# Patient Record
Sex: Female | Born: 1950 | Race: Black or African American | Hispanic: No | Marital: Single | Smoking: Current every day smoker
Health system: Southern US, Community
[De-identification: ages and names within clinical notes are randomized; demographics above are authoritative.]

## PROBLEM LIST (undated history)

## (undated) DIAGNOSIS — I10 Essential (primary) hypertension: Secondary | ICD-10-CM

## (undated) DIAGNOSIS — M810 Age-related osteoporosis without current pathological fracture: Secondary | ICD-10-CM

## (undated) DIAGNOSIS — K219 Gastro-esophageal reflux disease without esophagitis: Secondary | ICD-10-CM

## (undated) DIAGNOSIS — M1990 Unspecified osteoarthritis, unspecified site: Secondary | ICD-10-CM

## (undated) HISTORY — DX: Age-related osteoporosis without current pathological fracture: M81.0

## (undated) HISTORY — DX: Gastro-esophageal reflux disease without esophagitis: K21.9

## (undated) HISTORY — DX: Unspecified osteoarthritis, unspecified site: M19.90

## (undated) HISTORY — PX: TONSILLECTOMY: SUR1361

## (undated) HISTORY — PX: PERIPHERAL VASCULAR THROMBECTOMY: CATH118306

---

## 2009-07-16 ENCOUNTER — Emergency Department (HOSPITAL_BASED_OUTPATIENT_CLINIC_OR_DEPARTMENT_OTHER): Admission: EM | Admit: 2009-07-16 | Discharge: 2009-07-16 | Payer: Self-pay | Admitting: Emergency Medicine

## 2010-05-01 LAB — BASIC METABOLIC PANEL
BUN: 13 mg/dL (ref 6–23)
CO2: 28 mEq/L (ref 19–32)
Chloride: 105 mEq/L (ref 96–112)
Creatinine, Ser: 0.8 mg/dL (ref 0.4–1.2)
GFR calc Af Amer: >60 mL/min (ref >60)

## 2012-11-04 DIAGNOSIS — Z720 Tobacco use: Secondary | ICD-10-CM | POA: Insufficient documentation

## 2013-04-30 DIAGNOSIS — E876 Hypokalemia: Secondary | ICD-10-CM | POA: Insufficient documentation

## 2013-04-30 DIAGNOSIS — I10 Essential (primary) hypertension: Secondary | ICD-10-CM | POA: Insufficient documentation

## 2013-06-20 ENCOUNTER — Encounter (HOSPITAL_BASED_OUTPATIENT_CLINIC_OR_DEPARTMENT_OTHER): Payer: Self-pay | Admitting: Emergency Medicine

## 2013-06-20 ENCOUNTER — Emergency Department (HOSPITAL_BASED_OUTPATIENT_CLINIC_OR_DEPARTMENT_OTHER)
Admission: EM | Admit: 2013-06-20 | Discharge: 2013-06-20 | Disposition: A | Discharge status: Home / Self Care | Payer: No Typology Code available for payment source | Attending: Emergency Medicine | Admitting: Emergency Medicine

## 2013-06-20 ENCOUNTER — Emergency Department (HOSPITAL_BASED_OUTPATIENT_CLINIC_OR_DEPARTMENT_OTHER): Payer: No Typology Code available for payment source

## 2013-06-20 DIAGNOSIS — F17200 Nicotine dependence, unspecified, uncomplicated: Secondary | ICD-10-CM | POA: Insufficient documentation

## 2013-06-20 DIAGNOSIS — Y929 Unspecified place or not applicable: Secondary | ICD-10-CM | POA: Insufficient documentation

## 2013-06-20 DIAGNOSIS — S92309A Fracture of unspecified metatarsal bone(s), unspecified foot, initial encounter for closed fracture: Secondary | ICD-10-CM | POA: Insufficient documentation

## 2013-06-20 DIAGNOSIS — I10 Essential (primary) hypertension: Secondary | ICD-10-CM | POA: Insufficient documentation

## 2013-06-20 DIAGNOSIS — Y9302 Activity, running: Secondary | ICD-10-CM | POA: Insufficient documentation

## 2013-06-20 DIAGNOSIS — S92353A Displaced fracture of fifth metatarsal bone, unspecified foot, initial encounter for closed fracture: Secondary | ICD-10-CM

## 2013-06-20 DIAGNOSIS — Z79899 Other long term (current) drug therapy: Secondary | ICD-10-CM | POA: Insufficient documentation

## 2013-06-20 DIAGNOSIS — W010XXA Fall on same level from slipping, tripping and stumbling without subsequent striking against object, initial encounter: Secondary | ICD-10-CM | POA: Insufficient documentation

## 2013-06-20 HISTORY — DX: Essential (primary) hypertension: I10

## 2013-06-20 MED ORDER — HYDROCODONE-ACETAMINOPHEN 5-325 MG PO TABS: 1.0000 | ORAL_TABLET | ORAL | Status: DC | PRN

## 2013-06-20 NOTE — Discharge Instructions (Signed)
Metatarsal Fracture, Undisplaced A metatarsal fracture is a break in the bone(s) of the foot. These are the bones of the foot that connect your toes to the bones of the ankle. DIAGNOSIS  The diagnoses of these fractures are usually made with X-rays. If there are problems in the forefoot and x-rays are normal a later bone scan will usually make the diagnosis.  TREATMENT AND HOME CARE INSTRUCTIONS  Treatment may or may not include a cast or walking shoe. When casts are needed the use is usually for short periods of time so as not to slow down healing with muscle wasting (atrophy).  Activities should be stopped until further advised by your caregiver.  Wear shoes with adequate shock absorbing capabilities and stiff soles.  Alternative exercise may be undertaken while waiting for healing. These may include bicycling and swimming, or as your caregiver suggests.  It is important to keep all follow-up visits or specialty referrals. The failure to keep these appointments could result in improper bone healing and chronic pain or disability.  Warning: Do not drive a car or operate a motor vehicle until your caregiver specifically tells you it is safe to do so. IF YOU DO NOT HAVE A CAST OR SPLINT:  You may walk on your injured foot as tolerated or advised.  Do not put any weight on your injured foot for as long as directed by your caregiver. Slowly increase the amount of time you walk on the foot as the pain allows or as advised.  Use crutches until you can bear weight without pain. A gradual increase in weight bearing may help.  Apply ice to the injury for 15-20 minutes each hour while awake for the first 2 days. Put the ice in a plastic bag and place a towel between the bag of ice and your skin.  Only take over-the-counter or prescription medicines for pain, discomfort, or fever as directed by your caregiver. SEEK IMMEDIATE MEDICAL CARE IF:   Your cast gets damaged or breaks.  You have  continued severe pain or more swelling than you did before the cast was put on, or the pain is not controlled with medications.  Your skin or nails below the injury turn blue or grey, or feel cold or numb.  There is a bad smell, or new stains or pus-like (purulent) drainage coming from the cast. MAKE SURE YOU:   Understand these instructions.  Will watch your condition.  Will get help right away if you are not doing well or get worse. Document Released: 10/21/2001 Document Revised: 04/23/2011 Document Reviewed: 09/12/2007 [employer] Patient Information 2014 [employer].  

## 2013-06-20 NOTE — ED Notes (Signed)
Patient c/o R ankle pain, fell last Friday when running, swollen

## 2013-06-20 NOTE — ED Provider Notes (Signed)
CSN: [id number]     Arrival date & time 06/20/13  0734 History   First MD Initiated Contact with Patient 06/20/13 0745     Chief Complaint  Patient presents with  . Ankle Pain     (Consider location/radiation/quality/duration/timing/severity/associated sxs/prior Treatment) HPI Comments: Patient presents with right foot pain. She complains of constant throbbing pain to her right foot is worse when she bears weight on it. She had an injury that happened 7 days ago. She was running and tripped over a piece of luggage and twisted her right foot. She states that it's been hurting since that time that she's been taking ibuprofen and has been doing okay with that. However today she noticed some discoloration to the top of her foot and her toes and was concerned about it and came in to have it evaluated. She denies any other injuries.  Patient is a 63 y.o. female presenting with ankle pain.  Ankle Pain Associated symptoms: no back pain, no fever and no neck pain     Past Medical History  Diagnosis Date  . Hypertension    Past Surgical History  Procedure Laterality Date  . Tonsillectomy     No family history on file. History  Substance Use Topics  . Smoking status: Current Every Day Smoker  . Smokeless tobacco: Not on file  . Alcohol Use: Yes   OB History   Grav Para Term Preterm Abortions TAB SAB Ect Mult Living                 Review of Systems  Constitutional: Negative for fever.  Gastrointestinal: Negative for nausea and vomiting.  Musculoskeletal: Positive for arthralgias and joint swelling. Negative for back pain and neck pain.  Skin: Negative for wound.  Neurological: Negative for weakness, numbness and headaches.      Allergies  Review of patient's allergies indicates no known allergies.  Home Medications   Prior to Admission medications   Medication Sig Start Date End Date Taking? Authorizing Provider  hydrochlorothiazide (HYDRODIURIL) 25 MG tablet Take 25 mg by  mouth daily.   Yes Historical Provider, MD  potassium chloride (K-DUR) 10 MEQ tablet Take 10 mEq by mouth daily.   Yes Historical Provider, MD   BP 140/74  Pulse 76  Temp(Src) 98.2 F (36.8 C) (Oral)  Resp 18  SpO2 98% Physical Exam  Constitutional: She is oriented to person, place, and time. She appears well-developed and well-nourished.  HENT:  Head: Normocephalic and atraumatic.  Neck: Normal range of motion. Neck supple.  Cardiovascular: Normal rate.   Pulmonary/Chest: Effort normal.  Musculoskeletal: She exhibits edema and tenderness.  Patient has some swelling over the dorsum of the right foot, primarily along the fourth and fifth metatarsals. There's tenderness along the fourth and fifth metatarsals. She has some ecchymosis over the distal part of the foot and the middle toes. There is no deformities noted. She has normal sensation and motor function in the foot. Pedal pulses are intact. There are no wounds.  Neurological: She is alert and oriented to person, place, and time.  Skin: Skin is warm and dry.  Psychiatric: She has a normal mood and affect.    ED Course  Procedures (including critical care time) Labs Review Labs Reviewed - No data to display  Imaging Review Dg Foot Complete Right  06/20/2013   CLINICAL DATA:  Persistent foot pain after injury 1 week ago  EXAM: RIGHT FOOT COMPLETE - 3+ VIEW  COMPARISON:  None.  FINDINGS: Acute  minimally displaced fracture through the base of the fifth metatarsal. The position of the fracture is consistent with a Jones type injury. 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 Remainder the visualized bones and joints are unremarkable. Mild soft tissue swelling over the lateral foot.  IMPRESSION: Jones fracture of the base of the fifth metatarsal.   Electronically Signed   By: Malachy MoanHeath  McCullough M.D.   On: 06/20/2013 08:27     EKG Interpretation None      MDM   Final  diagnoses:  Fracture of 5th metatarsal    Patient is placed in a posterior splint. She was advised to be nonweightbearing. She was given crutches. She was given referral to followup with orthopedics within the next week. She was given a prescription for Vicodin to use in addition to her advil that she's been using at home.    Rolan BuccoMelanie Evalynn Hankins, MD 06/20/13 [id number]

## 2013-06-23 ENCOUNTER — Encounter: Payer: Self-pay | Admitting: Family Medicine

## 2013-06-23 ENCOUNTER — Ambulatory Visit (INDEPENDENT_AMBULATORY_CARE_PROVIDER_SITE_OTHER): Payer: 59 | Admitting: Family Medicine

## 2013-06-23 VITALS — BP 132/83 | HR 69 | Ht 63.0 in | Wt 158.0 lb

## 2013-06-23 DIAGNOSIS — S99199A Other physeal fracture of unspecified metatarsal, initial encounter for closed fracture: Secondary | ICD-10-CM

## 2013-06-23 DIAGNOSIS — S92309A Fracture of unspecified metatarsal bone(s), unspecified foot, initial encounter for closed fracture: Secondary | ICD-10-CM

## 2013-06-23 MED ORDER — HYDROCODONE-ACETAMINOPHEN 5-325 MG PO TABS: 1.0000 | ORAL_TABLET | Freq: Four times a day (QID) | ORAL | Status: DC | PRN

## 2013-06-23 NOTE — Patient Instructions (Signed)
You have a Jones fracture of the 5th metatarsal. Do not put any weight on this foot. Use crutches or a knee scooter to help get around. We placed a cast today. Elevate above the level of your heart as much as possible. Norco as needed for severe pain. Out of work for total of 6 weeks. Follow up with me in 2 weeks.

## 2013-06-24 ENCOUNTER — Encounter: Payer: Self-pay | Admitting: Family Medicine

## 2013-06-24 DIAGNOSIS — S99199A Other physeal fracture of unspecified metatarsal, initial encounter for closed fracture: Secondary | ICD-10-CM | POA: Insufficient documentation

## 2013-06-24 NOTE — Assessment & Plan Note (Signed)
discussed high risk of nonunion with this type of fracture.  Will start with conservative treatment - Short leg cast placed and patient to be strictly non weight bearing for at least 6 weeks.  Elevation, norco as needed.  Out of work at least 6 weeks.  F/u in 2 weeks to remove cast and repeat radiographs.

## 2013-06-24 NOTE — Progress Notes (Signed)
Patient ID: Stephanie Phillips, female   DOB: [birth year], 63 y.o.   MRN: [id number]  PCP: No PCP Per Patient  Subjective:   HPI: Patient is a 63 y.o. female here for right foot injury.  Patient reports on 5/1 she accidentally tri[place] luggage at the airport. Caused her to invert right ankle. Severe pain, swelling, bruising lateral foot. Unable to bear weight well following this - tried to give it more time but wasn't improving so went to ED on 5/9. Radiographs showed a Jones fracture of right fifth metatarsal. Placed in posterior splint, non weight bearing and referred to us. No prior injuries.  Past Medical History  Diagnosis Date  . Hypertension     Current Outpatient Prescriptions on File Prior to Visit  Medication Sig Dispense Refill  . hydrochlorothiazide (HYDRODIURIL) 25 MG tablet Take 25 mg by mouth daily.      . potassium chloride (K-DUR) 10 MEQ tablet Take 10 mEq by mouth daily.       No current facility-administered medications on file prior to visit.    Past Surgical History  Procedure Laterality Date  . Tonsillectomy      No Known Allergies  History   Social History  . Marital Status: Single    Spouse Name: N/A    Number of Children: N/A  . Years of Education: N/A   Occupational History  . Not on file.   Social History Main Topics  . Smoking status: Current Every Day Smoker -- 1.00 packs/day    Types: Cigarettes  . Smokeless tobacco: Not on file  . Alcohol Use: Yes  . Drug Use: No  . Sexual Activity: Not on file   Other Topics Concern  . Not on file   Social History Narrative  . No narrative on file    Family History  Problem Relation Age of Onset  . Hypertension Mother   . Hypertension Father     BP 132/83  Pulse 69  Ht 5\' 3"  (1.6 m)  Wt 158 lb (71.668 kg)  BMI 28.00 kg/m2  Review of Systems: See HPI above.    Objective:  Physical Exam:  Gen: NAD  Right foot/ankle: Splint removed. Moderate swelling, bruising dorsolateral  foot. Did not test ROM with known fracture. TTP base 5th metatarsal. Negative ant drawer and talar tilt.   Negative syndesmotic compression. Thompsons test negative. NV intact distally.    Assessment & Plan:  1. Jones fracture of right 5th metatarsal - discussed high risk of nonunion with this type of fracture.  Will start with conservative treatment - Short leg cast placed and patient to be strictly non weight bearing for at least 6 weeks.  Elevation, norco as needed.  Out of work at least 6 weeks.  F/u in 2 weeks to remove cast and repeat radiographs.

## 2013-07-07 ENCOUNTER — Encounter: Payer: Self-pay | Admitting: Family Medicine

## 2013-07-07 ENCOUNTER — Ambulatory Visit (HOSPITAL_BASED_OUTPATIENT_CLINIC_OR_DEPARTMENT_OTHER)
Admission: RE | Admit: 2013-07-07 | Discharge: 2013-07-07 | Disposition: A | Discharge status: Home / Self Care | Payer: 59 | Source: Ambulatory Visit | Attending: Family Medicine | Admitting: Family Medicine

## 2013-07-07 ENCOUNTER — Ambulatory Visit (INDEPENDENT_AMBULATORY_CARE_PROVIDER_SITE_OTHER): Payer: 59 | Admitting: Family Medicine

## 2013-07-07 VITALS — BP 148/76 | HR 114 | Temp 98.3°F | Wt 154.0 lb

## 2013-07-07 DIAGNOSIS — M79609 Pain in unspecified limb: Secondary | ICD-10-CM

## 2013-07-07 DIAGNOSIS — S92309A Fracture of unspecified metatarsal bone(s), unspecified foot, initial encounter for closed fracture: Secondary | ICD-10-CM | POA: Insufficient documentation

## 2013-07-07 DIAGNOSIS — S99199A Other physeal fracture of unspecified metatarsal, initial encounter for closed fracture: Secondary | ICD-10-CM

## 2013-07-07 DIAGNOSIS — X58XXXA Exposure to other specified factors, initial encounter: Secondary | ICD-10-CM | POA: Insufficient documentation

## 2013-07-07 DIAGNOSIS — M79671 Pain in right foot: Secondary | ICD-10-CM

## 2013-07-08 ENCOUNTER — Encounter: Payer: Self-pay | Admitting: Family Medicine

## 2013-07-08 NOTE — Assessment & Plan Note (Signed)
Jones fracture of right 5th metatarsal - Patient is clinically significantly better which is a good prognostic sign.  Radiographs show fracture line more visible but on my read may be more cloudy in actual fracture consistent with healing.  Discussed these sometimes become fibrous nonunions as well and clinical appearance is most important (especially as radiographs lag behind clinical healing by up to 4 weeks in many cases).  New short leg cast placed today.  Will f/u when she is 6 weeks out, remove cast, repeat radiographs, assess clinically.  Non weight bearing still.  Continue out of work until next visit at least.

## 2013-07-08 NOTE — Progress Notes (Signed)
Patient ID: Stephanie Phillips, female   DOB: 1950/06/04, 63 y.o.   MRN: [id number]  PCP: No PCP Per Patient  Subjective:   HPI: Patient is a 63 y.o. female here for right foot injury.  5/12: Patient reports on 5/1 she accidentally tripped over luggage at the airport. Caused her to invert right ankle. Severe pain, swelling, bruising lateral foot. Unable to bear weight well following this - tried to give it more time but wasn't improving so went to ED on 5/9. Radiographs showed a Jones fracture of right fifth metatarsal. Placed in posterior splint, non weight bearing and referred to [place]. No prior injuries.  5/26: Patient reports she's doing extremely well. Wearing cast and not weight bearing. No pain currently. Not taking any medicines for pain either.  Past Medical History  Diagnosis Date  . Hypertension     Current Outpatient Prescriptions on File Prior to Visit  Medication Sig Dispense Refill  . hydrochlorothiazide (HYDRODIURIL) 25 MG tablet Take 25 mg by mouth daily.      Marland Kitchen HYDROcodone-acetaminophen (NORCO/VICODIN) 5-325 MG per tablet Take 1 tablet by mouth every 6 (six) hours as needed.  60 tablet  0  . potassium chloride (K-DUR) 10 MEQ tablet Take 10 mEq by mouth daily.       No current facility-administered medications on file prior to visit.    Past Surgical History  Procedure Laterality Date  . Tonsillectomy      No Known Allergies  History   Social History  . Marital Status: Single    Spouse Name: N/A    Number of Children: N/A  . Years of Education: N/A   Occupational History  . Not on file.   Social History Main Topics  . Smoking status: Current Every Day Smoker -- 1.00 packs/day    Types: Cigarettes  . Smokeless tobacco: Not on file  . Alcohol Use: Yes  . Drug Use: No  . Sexual Activity: Not on file   Other Topics Concern  . Not on file   Social History Narrative  . No narrative on file    Family History  Problem Relation Age of Onset  .  Hypertension Mother   . Hypertension Father     BP 148/76  Pulse 114  Temp(Src) 98.3 F (36.8 C) (Oral)  Wt 154 lb (69.854 kg)  Review of Systems: See HPI above.    Objective:  Physical Exam:  Gen: NAD  Right foot/ankle: Cast removed. Minimal swelling, bruising dorsolateral foot. Did not test ROM with known fracture. No longer with TTP base 5th metatarsal. NV intact distally.    Assessment & Plan:  1. Jones fracture of right 5th metatarsal - Patient is clinically significantly better which is a good prognostic sign.  Radiographs show fracture line more visible but on my read may be more cloudy in actual fracture consistent with healing.  Discussed these sometimes become fibrous nonunions as well and clinical appearance is most important (especially as radiographs lag behind clinical healing by up to 4 weeks in many cases).  New short leg cast placed today.  Will f/u when she is 6 weeks out, remove cast, repeat radiographs, assess clinically.  Non weight bearing still.  Continue out of work until next visit at least.

## 2013-07-24 ENCOUNTER — Encounter: Payer: Self-pay | Admitting: Family Medicine

## 2013-07-24 ENCOUNTER — Ambulatory Visit (INDEPENDENT_AMBULATORY_CARE_PROVIDER_SITE_OTHER): Payer: 59 | Admitting: Family Medicine

## 2013-07-24 VITALS — BP 122/79 | HR 98 | Ht 63.0 in | Wt 150.0 lb

## 2013-07-24 DIAGNOSIS — S99199A Other physeal fracture of unspecified metatarsal, initial encounter for closed fracture: Secondary | ICD-10-CM

## 2013-07-24 DIAGNOSIS — S92309A Fracture of unspecified metatarsal bone(s), unspecified foot, initial encounter for closed fracture: Secondary | ICD-10-CM

## 2013-07-24 NOTE — Patient Instructions (Signed)
Wear walking boot when up and walking around until I see you back. Follow up with me in 4-6 weeks.

## 2013-07-27 ENCOUNTER — Encounter: Payer: Self-pay | Admitting: Family Medicine

## 2013-07-27 NOTE — Assessment & Plan Note (Signed)
Patient still without any pain and now 6 weeks out nonweight bearing with immobilization.  No tenderness on exam either.  Suspicious for a fibrous nonunion.  Regardless will continue with cam walker but over next 1-2 weeks start to put more weight on this.  Call if she has any pain while doing this - would make non weight bearing for longer period.  Otherwise f/u in 4-6 weeks.

## 2013-07-27 NOTE — Progress Notes (Signed)
Patient ID: Stephanie Phillips, female   DOB: 1950-10-18, 63 y.o.   MRN: [id number]  PCP: No PCP Per Patient  Subjective:   HPI: Patient is a 63 y.o. female here for right foot injury.  5/12: Patient reports on 5/1 she accidentally tripped over luggage at the airport. Caused her to invert right ankle. Severe pain, swelling, bruising lateral foot. Unable to bear weight well following this - tried to give it more time but wasn't improving so went to ED on 5/9. Radiographs showed a Jones fracture of right fifth metatarsal. Placed in posterior splint, non weight bearing and referred to us. No prior injuries.  5/26: Patient reports she's doing extremely well. Wearing cast and not weight bearing. No pain currently. Not taking any medicines for pain either.  6/15: Patient returns in cast. Not weight bearing. No pain. No other complaints.  Past Medical History  Diagnosis Date  . Hypertension     Current Outpatient Prescriptions on File Prior to Visit  Medication Sig Dispense Refill  . hydrochlorothiazide (HYDRODIURIL) 25 MG tablet Take 25 mg by mouth daily.      Marland Kitchen. HYDROcodone-acetaminophen (NORCO/VICODIN) 5-325 MG per tablet Take 1 tablet by mouth every 6 (six) hours as needed.  60 tablet  0  . potassium chloride (K-DUR) 10 MEQ tablet Take 10 mEq by mouth daily.       No current facility-administered medications on file prior to visit.    Past Surgical History  Procedure Laterality Date  . Tonsillectomy      No Known Allergies  History   Social History  . Marital Status: Single    Spouse Name: N/A    Number of Children: N/A  . Years of Education: N/A   Occupational History  . Not on file.   Social History Main Topics  . Smoking status: Current Every Day Smoker -- 1.00 packs/day    Types: Cigarettes  . Smokeless tobacco: Not on file  . Alcohol Use: Yes  . Drug Use: No  . Sexual Activity: Not on file   Other Topics Concern  . Not on file   Social History  Narrative  . No narrative on file    Family History  Problem Relation Age of Onset  . Hypertension Mother   . Hypertension Father     BP 122/79  Pulse 98  Ht 5\' 3"  (1.6 m)  Wt 150 lb (68.04 kg)  BMI 26.58 kg/m2  Review of Systems: See HPI above.    Objective:  Physical Exam:  Gen: NAD  Right foot/ankle: Cast removed. Minimal swelling, no bruising dorsolateral foot. Mod limitation ROM all directions. No longer with TTP base 5th metatarsal. NV intact distally.  MSK u/s:  Fracture line still visible but no edema, neovascularity at fracture site 5th metatarsal.    Assessment & Plan:  1. Jones fracture of right 5th metatarsal - Patient still without any pain and now 6 weeks out nonweight bearing with immobilization.  No tenderness on exam either.  Suspicious for a fibrous nonunion.  Regardless will continue with cam walker but over next 1-2 weeks start to put more weight on this.  Call if she has any pain while doing this - would make non weight bearing for longer period.  Otherwise f/u in 4-6 weeks.

## 2013-08-03 ENCOUNTER — Encounter: Payer: Self-pay | Admitting: Family Medicine

## 2013-08-03 ENCOUNTER — Telehealth: Payer: Self-pay | Admitting: Family Medicine

## 2013-08-03 NOTE — Telephone Encounter (Signed)
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

## 2013-08-28 ENCOUNTER — Encounter: Payer: Self-pay | Admitting: Family Medicine

## 2013-08-28 ENCOUNTER — Ambulatory Visit (INDEPENDENT_AMBULATORY_CARE_PROVIDER_SITE_OTHER): Payer: 59 | Admitting: Family Medicine

## 2013-08-28 VITALS — BP 135/82 | HR 75 | Ht 63.0 in | Wt 150.0 lb

## 2013-08-28 DIAGNOSIS — IMO0002 Reserved for concepts with insufficient information to code with codable children: Secondary | ICD-10-CM

## 2013-08-28 DIAGNOSIS — S92351K Displaced fracture of fifth metatarsal bone, right foot, subsequent encounter for fracture with nonunion: Secondary | ICD-10-CM

## 2013-08-28 NOTE — Patient Instructions (Signed)
Switch from the boot to a supportive shoe (avoiding flat shoes, barefoot walking as much as possible). Light duty for 2 more weeks then back to full duty. Follow up with me in 6 weeks.

## 2013-09-01 ENCOUNTER — Encounter: Payer: Self-pay | Admitting: Family Medicine

## 2013-09-01 NOTE — Progress Notes (Signed)
Patient ID: Delma PostJanet Brandau, female   DOB: 01-Aug-1950, 63 y.o.   MRN: [id number]  PCP: No PCP Per Patient  Subjective:   HPI: Patient is a 63 y.o. female here for right foot injury.  5/12: Patient reports on 5/1 she accidentally tripped over luggage at the airport. Caused her to invert right ankle. Severe pain, swelling, bruising lateral foot. Unable to bear weight well following this - tried to give it more time but wasn't improving so went to ED on 5/9. Radiographs showed a Jones fracture of right fifth metatarsal. Placed in posterior splint, non weight bearing and referred to us. No prior injuries.  5/26: Patient reports she's doing extremely well. Wearing cast and not weight bearing. No pain currently. Not taking any medicines for pain either.  6/15: Patient returns in cast. Not weight bearing. No pain. No other complaints.  7/17: Patient reports she is doing well. Back at work and driving without any problems. No pain though has some swelling. Has been icing for the swelling.  Past Medical History  Diagnosis Date  . Hypertension     Current Outpatient Prescriptions on File Prior to Visit  Medication Sig Dispense Refill  . hydrochlorothiazide (HYDRODIURIL) 25 MG tablet Take 25 mg by mouth daily.      Marland Kitchen. HYDROcodone-acetaminophen (NORCO/VICODIN) 5-325 MG per tablet Take 1 tablet by mouth every 6 (six) hours as needed.  60 tablet  0  . potassium chloride (K-DUR) 10 MEQ tablet Take 10 mEq by mouth daily.       No current facility-administered medications on file prior to visit.    Past Surgical History  Procedure Laterality Date  . Tonsillectomy      No Known Allergies  History   Social History  . Marital Status: Single    Spouse Name: N/A    Number of Children: N/A  . Years of Education: N/A   Occupational History  . Not on file.   Social History Main Topics  . Smoking status: Current Every Day Smoker -- 1.00 packs/day    Types: Cigarettes  .  Smokeless tobacco: Not on file  . Alcohol Use: Yes  . Drug Use: No  . Sexual Activity: Not on file   Other Topics Concern  . Not on file   Social History Narrative  . No narrative on file    Family History  Problem Relation Age of Onset  . Hypertension Mother   . Hypertension Father     BP 135/82  Pulse 75  Ht 5\' 3"  (1.6 m)  Wt 150 lb (68.04 kg)  BMI 26.58 kg/m2  Review of Systems: See HPI above.    Objective:  Physical Exam:  Gen: NAD  Right foot/ankle: Minimal swelling, no bruising dorsolateral foot. FROM without pain. No longer with TTP base 5th metatarsal. NV intact distally.  MSK u/s:  Fracture line still visible but no edema, no neovascularity at fracture site 5th metatarsal.    Assessment & Plan:  1. Jones fracture of right 5th metatarsal - Patient still without any pain and now 11 weeks out having no problems other than still having swelling which can persist.  No tenderness on exam, FROM.  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  Switch to supportive shoe.  Light duty 2 weeks then to full duty.  F/u in 6 weeks.  Call with any problems.

## 2013-09-01 NOTE — Assessment & Plan Note (Signed)
Jones fracture of right 5th metatarsal - Patient still without any pain and now 11 weeks out having no problems other than still having swelling which can persist.  No tenderness on exam, FROM.  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  Switch to supportive shoe.  Light duty 2 weeks then to full duty.  F/u in 6 weeks.  Call with any problems.

## 2013-10-02 ENCOUNTER — Ambulatory Visit (INDEPENDENT_AMBULATORY_CARE_PROVIDER_SITE_OTHER): Payer: 59 | Admitting: Family Medicine

## 2013-10-02 ENCOUNTER — Encounter: Payer: Self-pay | Admitting: Family Medicine

## 2013-10-02 VITALS — BP 119/75 | Ht 63.0 in | Wt 150.0 lb

## 2013-10-02 DIAGNOSIS — S92351K Displaced fracture of fifth metatarsal bone, right foot, subsequent encounter for fracture with nonunion: Secondary | ICD-10-CM

## 2013-10-02 DIAGNOSIS — IMO0002 Reserved for concepts with insufficient information to code with codable children: Secondary | ICD-10-CM

## 2013-10-05 ENCOUNTER — Encounter: Payer: Self-pay | Admitting: Family Medicine

## 2013-10-05 NOTE — Progress Notes (Signed)
Patient ID: Stephanie Phillips, female   DOB: 30-Apr-1950, 63 y.o.   MRN: [id number]  PCP: No PCP Per Patient  Subjective:   HPI: Patient is a 63 y.o. female here for right foot injury.  5/12: Patient reports on 5/1 she accidentally tri[place] luggage at the airport. Caused her to invert right ankle. Severe pain, swelling, bruising lateral foot. Unable to bear weight well following this - tried to give it more time but wasn't improving so went to ED on 5/9. Radiographs showed a Jones fracture of right fifth metatarsal. Placed in posterior splint, non weight bearing and referred to [place]. No prior injuries.  5/26: Patient reports she's doing extremely well. Wearing cast and not weight bearing. No pain currently. Not taking any medicines for pain either.  6/15: Patient returns in cast. Not weight bearing. No pain. No other complaints.  7/17: Patient reports she is doing well. Back at work and driving without any problems. No pain though has some swelling. Has been icing for the swelling.  8/21: Patient having no pain now. No other complaints. Walking with regular shoes - no limitations on her activities. No problems at work.  Past Medical History  Diagnosis Date  . Hypertension     Current Outpatient Prescriptions on File Prior to Visit  Medication Sig Dispense Refill  . hydrochlorothiazide (HYDRODIURIL) 25 MG tablet Take 25 mg by mouth daily.      Marland Kitchen HYDROcodone-acetaminophen (NORCO/VICODIN) 5-325 MG per tablet Take 1 tablet by mouth every 6 (six) hours as needed.  60 tablet  0  . potassium chloride (K-DUR) 10 MEQ tablet Take 10 mEq by mouth daily.       No current facility-administered medications on file prior to visit.    Past Surgical History  Procedure Laterality Date  . Tonsillectomy      No Known Allergies  History   Social History  . Marital Status: Single    Spouse Name: N/A    Number of Children: N/A  . Years of Education: N/A   Occupational  History  . Not on file.   Social History Main Topics  . Smoking status: Current Every Day Smoker -- 1.00 packs/day    Types: Cigarettes  . Smokeless tobacco: Not on file  . Alcohol Use: Yes  . Drug Use: No  . Sexual Activity: Not on file   Other Topics Concern  . Not on file   Social History Narrative  . No narrative on file    Family History  Problem Relation Age of Onset  . Hypertension Mother   . Hypertension Father     BP 119/75  Ht  (1.6 m)  Wt 150 lb (68.04 kg)  BMI 26.58 kg/m2  Review of Systems: See HPI above.    Objective:  Physical Exam:  Gen: NAD  Right foot/ankle: No swelling, bruising dorsolateral foot. FROM without pain. No longer with TTP base 5th metatarsal. NV intact distally.    Assessment & Plan:  1. 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  At work full duty and no issues.  Advised to call us with any concerns.  F/u prn.

## 2013-10-05 NOTE — Assessment & Plan Note (Signed)
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  At work full duty and no issues.  Advised to call us with any concerns.  F/u prn.

## 2015-04-13 ENCOUNTER — Ambulatory Visit (INDEPENDENT_AMBULATORY_CARE_PROVIDER_SITE_OTHER): Payer: 59 | Admitting: Family Medicine

## 2015-04-13 ENCOUNTER — Ambulatory Visit (HOSPITAL_BASED_OUTPATIENT_CLINIC_OR_DEPARTMENT_OTHER)
Admission: RE | Admit: 2015-04-13 | Discharge: 2015-04-13 | Disposition: A | Discharge status: Home / Self Care | Payer: 59 | Source: Ambulatory Visit | Attending: Family Medicine | Admitting: Family Medicine

## 2015-04-13 ENCOUNTER — Encounter: Payer: Self-pay | Admitting: Family Medicine

## 2015-04-13 VITALS — BP 124/87 | HR 69 | Ht 62.0 in | Wt 160.0 lb

## 2015-04-13 DIAGNOSIS — M25562 Pain in left knee: Secondary | ICD-10-CM | POA: Diagnosis not present

## 2015-04-13 DIAGNOSIS — M79672 Pain in left foot: Secondary | ICD-10-CM

## 2015-04-13 MED ORDER — DICLOFENAC SODIUM 75 MG PO TBEC: 75.0000 mg | DELAYED_RELEASE_TABLET | Freq: Two times a day (BID) | ORAL | Status: DC

## 2015-04-13 NOTE — Patient Instructions (Addendum)
You have an extensor tendinitis of your foot. Take voltaren  twice a day with food for 7-10 days then as needed for pain and inflammation. Icing 15 minutes at a time as needed. Do simple motion exercises of your ankle daily. Consider physical therapy - call me if you want to do this.  You also flared up the arthritis in this knee. These are the different medicines you can take for this: Tylenol  1-2 tabs three times a day for pain. Voltaren  twice a day with food for pain and inflammation. Glucosamine sulfate  twice a day is a supplement that may help. Capsaicin, aspercreme, or biofreeze topically up to four times a day may also help with pain. Cortisone injections are an option. If cortisone injections do not help, there are different types of shots that may help but they take longer to take effect. It's important that you continue to stay active. Straight leg raises, knee extensions 3 sets of 10 once a day (add ankle weight if these become too easy). Consider physical therapy to strengthen muscles around the joint that hurts to take pressure off of the joint itself. Shoe inserts with good arch support may be helpful. Heat or ice 15 minutes at a time 3-4 times a day as needed to help with pain. Water aerobics and cycling with low resistance are the best two types of exercise for arthritis. Follow up with me in 5-6 weeks or as needed. Call me sooner if you want to do the injection.

## 2015-04-14 DIAGNOSIS — M25562 Pain in left knee: Secondary | ICD-10-CM | POA: Insufficient documentation

## 2015-04-14 DIAGNOSIS — M79672 Pain in left foot: Secondary | ICD-10-CM | POA: Insufficient documentation

## 2015-04-14 NOTE — Assessment & Plan Note (Signed)
2/2 flare of DJD.  Tylenol, voltaren, glucosamine and topical medications discussed.  Shown home exercises to do daily.  Heat or ice if needed.  F/u in 5-6 weeks or prn.  Consider injection, PT if not improving.

## 2015-04-14 NOTE — Assessment & Plan Note (Signed)
Left extensor tendinitis - Radiographs and ultrasound independently reviewed - no evidence fracture.  Voltaren, icing, ROM exercises.  Declined physical therapy, cam walker for now.  F/u in 5-6 weeks or prn.

## 2015-04-14 NOTE — Progress Notes (Signed)
PCP: No PCP Per Patient  Subjective:   HPI: Patient is a 65 y.o. female here for left knee, left foot pain.  Patient reports for about 5 months she's had pain in left knee and left foot. Started with dropping conditioner bottle on her foot - pain never improved. Pain dull, constant at 5/10 level mid dorsal foot. Taking tylenol. Knee started hurting anteriorly as well shortly after this - pain here now 6/10 level, sharp. No skin changes, fever, other complaints.  Past Medical History  Diagnosis Date  . Hypertension     Current Outpatient Prescriptions on File Prior to Visit  Medication Sig Dispense Refill  . hydrochlorothiazide (HYDRODIURIL) 25 MG tablet Take 25 mg by mouth daily.    Marland Kitchen HYDROcodone-acetaminophen (NORCO/VICODIN) 5-325 MG per tablet Take 1 tablet by mouth every 6 (six) hours as needed. 60 tablet 0  . potassium chloride (K-DUR) 10 MEQ tablet Take 10 mEq by mouth daily.     No current facility-administered medications on file prior to visit.    Past Surgical History  Procedure Laterality Date  . Tonsillectomy      No Known Allergies  Social History   Social History  . Marital Status: Single    Spouse Name: N/A  . Number of Children: N/A  . Years of Education: N/A   Occupational History  . Not on file.   Social History Main Topics  . Smoking status: Current Every Day Smoker -- 1.00 packs/day    Types: Cigarettes  . Smokeless tobacco: Not on file  . Alcohol Use: 0.0 oz/week    0 Standard drinks or equivalent per week  . Drug Use: No  . Sexual Activity: Not on file   Other Topics Concern  . Not on file   Social History Narrative    Family History  Problem Relation Age of Onset  . Hypertension Mother   . Hypertension Father     BP 124/87 mmHg  Pulse 69  Ht  (1.575 m)  Wt 160 lb (72.576 kg)  BMI 29.26 kg/m2  Review of Systems: See HPI above.    Objective:  Physical Exam:  Gen: NAD, comfortable in exam room  Left knee: No  gross deformity, ecchymoses, swelling. TTP medial > lateral joint line. FROM. Negative ant/post drawers. Negative valgus/varus testing. Negative lachmanns. Negative mcmurrays, apleys, patellar apprehension. NV intact distally.  Left foot/ankle: No gross deformity, swelling, ecchymoses FROM without pain. TTP dorsal foot over extensor digitorum as crosses proximal metatarsals. Negative ant drawer and talar tilt.   Negative syndesmotic compression. Thompsons test negative. NV intact distally.  MSK u/s:  No cortical irregularity, edema overlying cortices of metatarsals of left foot.    Assessment & Plan:  1. Left knee pain - 2/2 flare of DJD.  Tylenol, voltaren, glucosamine and topical medications discussed.  Shown home exercises to do daily.  Heat or ice if needed.  F/u in 5-6 weeks or prn.  Consider injection, PT if not improving.  2. Left extensor tendinitis - Radiographs and ultrasound independently reviewed - no evidence fracture.  Voltaren, icing, ROM exercises.  Declined physical therapy, cam walker for now.  F/u in 5-6 weeks or prn.

## 2015-05-18 ENCOUNTER — Ambulatory Visit (INDEPENDENT_AMBULATORY_CARE_PROVIDER_SITE_OTHER): Payer: 59 | Admitting: Family Medicine

## 2015-05-18 ENCOUNTER — Encounter: Payer: Self-pay | Admitting: Family Medicine

## 2015-05-18 VITALS — BP 102/69 | HR 86 | Ht 63.0 in | Wt 160.0 lb

## 2015-05-18 DIAGNOSIS — M79672 Pain in left foot: Secondary | ICD-10-CM

## 2015-05-18 DIAGNOSIS — M25562 Pain in left knee: Secondary | ICD-10-CM | POA: Diagnosis not present

## 2015-05-19 NOTE — Assessment & Plan Note (Signed)
Left extensor tendinitis - Radiographs and ultrasound independently reviewed last visit - no evidence fracture.  Take diclofenac only as needed now.  F/u prn.

## 2015-05-19 NOTE — Assessment & Plan Note (Signed)
2/2 flare of DJD.  Improved with tylenol, voltaren.  Continue home exercises.  F/u prn.

## 2015-05-19 NOTE — Progress Notes (Signed)
PCP: No PCP Per Patient  Subjective:   HPI: Patient is a 65 y.o. female here for left knee, left foot pain.  3/1: Patient reports for about 5 months she's had pain in left knee and left foot. Started with dropping conditioner bottle on her foot - pain never improved. Pain dull, constant at 5/10 level mid dorsal foot. Taking tylenol. Knee started hurting anteriorly as well shortly after this - pain here now 6/10 level, sharp. No skin changes, fever, other complaints.  4/5: Patient reports she's doing very well. Her pain level is down to 1/10 of foot. No knee pain now. Taking diclofenac No swelling. No skin changes, fever.  Past Medical History  Diagnosis Date  . Hypertension     Current Outpatient Prescriptions on File Prior to Visit  Medication Sig Dispense Refill  . alendronate (FOSAMAX) 70 MG tablet TAKE 1 TAB WEEKLY IN AM ON EMPTY STOMACH WITH FULL GLASS OF WATER.DONT LIE DOWN OR EAT FOR 45 MIN.  3  . diclofenac (VOLTAREN) 75 MG EC tablet Take 1 tablet (75 mg total) by mouth 2 (two) times daily. 60 tablet 1  . hydrochlorothiazide (HYDRODIURIL) 25 MG tablet Take 25 mg by mouth daily.    Marland Kitchen. HYDROcodone-acetaminophen (NORCO/VICODIN) 5-325 MG per tablet Take 1 tablet by mouth every 6 (six) hours as needed. 60 tablet 0  . potassium chloride (K-DUR) 10 MEQ tablet Take 10 mEq by mouth daily.    . Vitamin D, Ergocalciferol, (DRISDOL) 50000 units CAPS capsule TAKE ONE CAPSULE BY MOUTH TWICE A WEEK  1   No current facility-administered medications on file prior to visit.    Past Surgical History  Procedure Laterality Date  . Tonsillectomy      No Known Allergies  Social History   Social History  . Marital Status: Single    Spouse Name: N/A  . Number of Children: N/A  . Years of Education: N/A   Occupational History  . Not on file.   Social History Main Topics  . Smoking status: Current Every Day Smoker -- 1.00 packs/day    Types: Cigarettes  . Smokeless tobacco:  Not on file  . Alcohol Use: 0.0 oz/week    0 Standard drinks or equivalent per week  . Drug Use: No  . Sexual Activity: Not on file   Other Topics Concern  . Not on file   Social History Narrative    Family History  Problem Relation Age of Onset  . Hypertension Mother   . Hypertension Father     BP 102/69 mmHg  Pulse 86  Ht 5\' 3"  (1.6 m)  Wt 160 lb (72.576 kg)  BMI 28.35 kg/m2  Review of Systems: See HPI above.    Objective:  Physical Exam:  Gen: NAD, comfortable in exam room  Left foot/ankle: No gross deformity, swelling, ecchymoses FROM without pain. No TTP. Negative ant drawer and talar tilt.   Negative syndesmotic compression. Thompsons test negative. NV intact distally.    Assessment & Plan:  1. Left knee pain - 2/2 flare of DJD.  Improved with tylenol, voltaren.  Continue home exercises.  F/u prn.  2. Left extensor tendinitis - Radiographs and ultrasound independently reviewed last visit - no evidence fracture.  Take diclofenac only as needed now.  F/u prn.

## 2015-06-16 ENCOUNTER — Ambulatory Visit (INDEPENDENT_AMBULATORY_CARE_PROVIDER_SITE_OTHER): Payer: 59 | Admitting: Family Medicine

## 2015-06-16 ENCOUNTER — Encounter: Payer: Self-pay | Admitting: Family Medicine

## 2015-06-16 ENCOUNTER — Telehealth: Payer: Self-pay | Admitting: Family Medicine

## 2015-06-16 VITALS — BP 131/74 | HR 69 | Temp 97.8°F | Ht 62.5 in | Wt 167.2 lb

## 2015-06-16 DIAGNOSIS — M810 Age-related osteoporosis without current pathological fracture: Secondary | ICD-10-CM | POA: Diagnosis not present

## 2015-06-16 DIAGNOSIS — Z131 Encounter for screening for diabetes mellitus: Secondary | ICD-10-CM | POA: Diagnosis not present

## 2015-06-16 DIAGNOSIS — Z13220 Encounter for screening for lipoid disorders: Secondary | ICD-10-CM | POA: Diagnosis not present

## 2015-06-16 DIAGNOSIS — I10 Essential (primary) hypertension: Secondary | ICD-10-CM

## 2015-06-16 DIAGNOSIS — Z23 Encounter for immunization: Secondary | ICD-10-CM

## 2015-06-16 DIAGNOSIS — Z119 Encounter for screening for infectious and parasitic diseases, unspecified: Secondary | ICD-10-CM

## 2015-06-16 DIAGNOSIS — Z130 Encounter for screening for diseases of the blood and blood-forming organs and certain disorders involving the immune mechanism: Secondary | ICD-10-CM

## 2015-06-16 HISTORY — DX: Age-related osteoporosis without current pathological fracture: M81.0

## 2015-06-16 MED ORDER — POTASSIUM CHLORIDE ER 10 MEQ PO TBCR: 10.0000 meq | EXTENDED_RELEASE_TABLET | Freq: Every day | ORAL | Status: DC

## 2015-06-16 MED ORDER — HYDROCHLOROTHIAZIDE 25 MG PO TABS: 25.0000 mg | ORAL_TABLET | Freq: Every day | ORAL | Status: DC

## 2015-06-16 NOTE — Patient Instructions (Signed)
It was very nice to meet you today I will be in touch with your labs asap Please sign a medical records release for us on the way out so we can get your old labs, etc Please see me in 6 months or so for a physical exam

## 2015-06-16 NOTE — Progress Notes (Signed)
[facility] [address], [address] [place], [place] [address] [phone] [phone]  Date:  06/16/2015   Name:  Stephanie PostJanet Phillips   DOB:  1950-12-04   MRN:  [id number]  PCP:  Abbe AmsterdamOPLAND,Tysha Grismore, MD    Chief Complaint: Establish Care   History of Present Illness:  Stephanie Phillips is a 65 y.o. very pleasant female patient who presents with the following:  She is here today to establish care.  She had been going to an UC but her provider moved and she wanted to find a more stable medical home.   She did have a recent left foot injury but it is getting better  She does take reglan for nausea as needed mammo last year  No recent blood work, would like to do today  Her BP is well controlled on her current medicatoin She has not noted any SE of medication No CP, no SOB She started the fosamax just this year for osteoporosis She is taking 50k of vitamin D twice a week- she has been taking this for a few months   She is "not much into vaccines-" did agree to have a tdap today however.  Last tetanus over 10 years ago   Patient Active Problem List   Diagnosis Date Noted  . Left knee pain 04/14/2015  . Left foot pain 04/14/2015  . Jones fracture 06/24/2013  . Benign hypertension 04/30/2013  . Decreased potassium in the blood 04/30/2013  . Current tobacco use 11/04/2012    Past Medical History  Diagnosis Date  . Hypertension   . Arthritis     Past Surgical History  Procedure Laterality Date  . Tonsillectomy      Social History  Substance Use Topics  . Smoking status: Current Every Day Smoker -- 1.00 packs/day    Types: Cigarettes  . Smokeless tobacco: None  . Alcohol Use: 0.0 oz/week    0 Standard drinks or equivalent per week    Family History  Problem Relation Age of Onset  . Hypertension Mother   . Hypertension Father     No Known Allergies  Medication list has been reviewed and updated.  Current Outpatient  Prescriptions on File Prior to Visit  Medication Sig Dispense Refill  . alendronate (FOSAMAX) 70 MG tablet TAKE 1 TAB WEEKLY IN AM ON EMPTY STOMACH WITH FULL GLASS OF WATER.DONT LIE DOWN OR EAT FOR 45 MIN.  3  . diclofenac (VOLTAREN) 75 MG EC tablet Take 1 tablet (75 mg total) by mouth 2 (two) times daily. 60 tablet 1  . hydrochlorothiazide (HYDRODIURIL) 25 MG tablet Take 25 mg by mouth daily.    . metoCLOPramide (REGLAN) 10 MG tablet Take 10 mg by mouth at bedtime.  5  . potassium chloride (K-DUR) 10 MEQ tablet Take 10 mEq by mouth daily.    . Vitamin D, Ergocalciferol, (DRISDOL) 50000 units CAPS capsule TAKE ONE CAPSULE BY MOUTH TWICE A WEEK  1   No current facility-administered medications on file prior to visit.    Review of Systems:  As per HPI- otherwise negative.   Physical Examination: Filed Vitals:   06/16/15 1613  BP: 131/74  Pulse: 69  Temp: 97.8 F (36.6 C)   Filed Vitals:   06/16/15 1613  Height: 5' 2.5" (1.588 m)  Weight: 167 lb 3.2 oz (75.841 kg)   Body mass index is 30.07 kg/(m^2). Ideal Body Weight: Weight in (lb) to have BMI = 25: 138.6  GEN:  WDWN, NAD, Non-toxic, A & O x 3, overweight, looks well HEENT: Atraumatic, Normocephalic. Neck supple. No masses, No LAD.  Bilateral TM wnl, oropharynx normal.  PEERL,EOMI.   Ears and Nose: No external deformity. CV: RRR, No M/G/R. No JVD. No thrill. No extra heart sounds. PULM: CTA B, no wheezes, crackles, rhonchi. No retractions. No resp. distress. No accessory muscle use. EXTR: No c/c/e NEURO Normal gait.  PSYCH: Normally interactive. Conversant. Not depressed or anxious appearing.  Calm demeanor.    Assessment and Plan: Essential hypertension - Plan: hydrochlorothiazide (HYDRODIURIL) 25 MG tablet, potassium chloride (K-DUR) 10 MEQ tablet  Osteoporosis - Plan: Vitamin D (25 hydroxy)  Screening examination for infectious disease - Plan: Hepatitis C antibody  Screening for hyperlipidemia - Plan: Lipid  panel  Screening for diabetes mellitus - Plan: Comprehensive metabolic panel, Hemoglobin A1c  Screening for deficiency anemia - Plan: CBC  Immunization due - Plan: Tdap vaccine greater than or equal to 7yo IM  Refilled her BP medication and potassium supplement Check Vit D level Hep C screening tdap Will plan further follow- up pending labs. Recheck in 6 months   Signed Abbe Amsterdam, MD

## 2015-06-16 NOTE — Telephone Encounter (Signed)
Faxed medical request form to [phone] [facility]

## 2015-06-16 NOTE — Progress Notes (Signed)
Pre visit review using our clinic review tool, if applicable. No additional management support is needed unless otherwise documented below in the visit note. 

## 2015-06-17 LAB — CBC
HEMATOCRIT: 36.4 % (ref 36.0–46.0)
HEMOGLOBIN: 12.2 g/dL (ref 12.0–15.0)
MCHC: 33.6 g/dL (ref 30.0–36.0)
MCV: 89.9 fl (ref 78.0–100.0)
PLATELETS: 355 10*3/uL (ref 150.0–400.0)
RBC: 4.05 Mil/uL (ref 3.87–5.11)
RDW: 13.3 % (ref 11.5–15.5)
WBC: 7.6 10*3/uL (ref 4.0–10.5)

## 2015-06-17 LAB — COMPREHENSIVE METABOLIC PANEL
ALBUMIN: 4.3 g/dL (ref 3.5–5.2)
ALK PHOS: 86 U/L (ref 39–117)
ALT: 14 U/L (ref 0–35)
AST: 17 U/L (ref 0–37)
BILIRUBIN TOTAL: 0.5 mg/dL (ref 0.2–1.2)
BUN: 14 mg/dL (ref 6–23)
CO2: 31 mEq/L (ref 19–32)
Calcium: 9.7 mg/dL (ref 8.4–10.5)
Chloride: 100 mEq/L (ref 96–112)
Creatinine, Ser: 1.03 mg/dL (ref 0.40–1.20)
GFR: 69.72 mL/min (ref >60.00)
GLUCOSE: 99 mg/dL (ref 70–99)
POTASSIUM: 3.5 meq/L (ref 3.5–5.1)
Sodium: 139 mEq/L (ref 135–145)
TOTAL PROTEIN: 7.2 g/dL (ref 6.0–8.3)

## 2015-06-17 LAB — LIPID PANEL
CHOLESTEROL: 168 mg/dL (ref 0–200)
HDL: 43.9 mg/dL (ref >39.00)
LDL Cholesterol: 102 mg/dL — ABNORMAL HIGH (ref 0–99)
NONHDL: 123.88
Total CHOL/HDL Ratio: 4
Triglycerides: 107 mg/dL (ref 0.0–149.0)
VLDL: 21.4 mg/dL (ref 0.0–40.0)

## 2015-06-17 LAB — VITAMIN D 25 HYDROXY (VIT D DEFICIENCY, FRACTURES): VITD: 44.12 ng/mL (ref 30.00–100.00)

## 2015-06-17 LAB — HEPATITIS C ANTIBODY: HCV Ab: NEGATIVE

## 2015-06-17 LAB — HEMOGLOBIN A1C: HEMOGLOBIN A1C: 6.1 % (ref 4.6–6.5)

## 2015-06-19 ENCOUNTER — Encounter: Payer: Self-pay | Admitting: Family Medicine

## 2015-07-08 ENCOUNTER — Telehealth: Payer: Self-pay | Admitting: *Deleted

## 2015-07-08 NOTE — Telephone Encounter (Signed)
Forwarded to Dr. Copland/Tanesha. JG//CMA 

## 2015-07-14 ENCOUNTER — Other Ambulatory Visit: Payer: Self-pay | Admitting: Family Medicine

## 2015-07-14 NOTE — Progress Notes (Signed)
[facility] [place]

## 2015-12-21 ENCOUNTER — Encounter: Payer: 59 | Admitting: Family Medicine

## 2016-02-02 ENCOUNTER — Encounter: Payer: 59 | Admitting: Family Medicine

## 2016-02-13 ENCOUNTER — Encounter (HOSPITAL_BASED_OUTPATIENT_CLINIC_OR_DEPARTMENT_OTHER): Payer: Self-pay | Admitting: *Deleted

## 2016-02-13 ENCOUNTER — Emergency Department (HOSPITAL_BASED_OUTPATIENT_CLINIC_OR_DEPARTMENT_OTHER)
Admission: EM | Admit: 2016-02-13 | Discharge: 2016-02-13 | Disposition: A | Discharge status: Home / Self Care | Payer: Managed Care, Other (non HMO) | Attending: Emergency Medicine | Admitting: Emergency Medicine

## 2016-02-13 DIAGNOSIS — R21 Rash and other nonspecific skin eruption: Secondary | ICD-10-CM | POA: Diagnosis present

## 2016-02-13 DIAGNOSIS — I10 Essential (primary) hypertension: Secondary | ICD-10-CM | POA: Insufficient documentation

## 2016-02-13 DIAGNOSIS — Z79899 Other long term (current) drug therapy: Secondary | ICD-10-CM | POA: Diagnosis not present

## 2016-02-13 DIAGNOSIS — F17210 Nicotine dependence, cigarettes, uncomplicated: Secondary | ICD-10-CM | POA: Insufficient documentation

## 2016-02-13 DIAGNOSIS — L240 Irritant contact dermatitis due to detergents: Secondary | ICD-10-CM | POA: Diagnosis not present

## 2016-02-13 MED ORDER — CETIRIZINE HCL 10 MG PO TABS: 10.0000 mg | ORAL_TABLET | Freq: Every evening | ORAL | Status: DC | PRN

## 2016-02-13 MED ORDER — METHYLPREDNISOLONE 4 MG PO TBPK: ORAL_TABLET | ORAL | 0 refills | Status: DC

## 2016-02-13 NOTE — ED Triage Notes (Signed)
Pt reports rash from using different detergent x 2 days.  No hives noted.  Very slight rash noted to pts shoulders.

## 2016-02-13 NOTE — ED Provider Notes (Signed)
   [facility] Provider Note: Lowella DellJ. Lane Bani Gianfrancesco, MD, FACEP  CSN: [id number] MRN: [id number] ARRIVAL: 02/13/16 at 1927 ROOM: MH09/MH09   CHIEF COMPLAINT  Rash   HISTORY OF PRESENT ILLNESS  Stephanie Phillips is a 66 y.o. female who complains of a generalized pruritic rash which she attributes to using a different detergent 2 days ago. The rash is fine and maculopapular. She denies difficulty breathing, vomiting or diarrhea.   Past Medical History:  Diagnosis Date  . Arthritis   . Hypertension   . Osteoporosis 06/16/2015    Past Surgical History:  Procedure Laterality Date  . TONSILLECTOMY      Family History  Problem Relation Age of Onset  . Hypertension Mother   . Hypertension Father     Social History  Substance Use Topics  . Smoking status: Current Every Day Smoker    Packs/day: 1.00    Types: Cigarettes  . Smokeless tobacco: Not on file  . Alcohol use 0.0 oz/week    Prior to Admission medications   Medication Sig Start Date End Date Taking? Authorizing Provider  alendronate (FOSAMAX) 70 MG tablet TAKE 1 TAB WEEKLY IN AM ON EMPTY STOMACH WITH FULL GLASS OF WATER.DONT LIE DOWN OR EAT FOR 45 MIN. 02/23/15   Historical Provider, MD  hydrochlorothiazide (HYDRODIURIL) 25 MG tablet Take 1 tablet (25 mg total) by mouth daily. 06/16/15   Pearline CablesJessica C Copland, MD    Allergies Patient has no known allergies.   REVIEW OF SYSTEMS  Negative except as noted here or in the History of Present Illness.   PHYSICAL EXAMINATION  Initial Vital Signs Blood pressure 124/75, pulse 82, temperature 98.2 F (36.8 C), temperature source Oral, resp. rate 18, height 5\' 2"  (1.575 m), weight 158 lb (71.7 kg), SpO2 96 %.  Examination General: Well-developed, well-nourished female in no acute distress; appearance consistent with age of record HENT: normocephalic; atraumatic; no dysphonia Eyes: pupils equal, round and reactive to light; extraocular muscles intact Neck: supple Heart:  regular rate and rhythm Lungs: clear to auscultation bilaterally Abdomen: soft; nondistended; nontender; bowel sounds present Extremities: No deformity; full range of motion Neurologic: Awake, alert and oriented; motor function intact in all extremities and symmetric; no facial droop Skin: Warm and dry; generalized, mildly erythematous, maculopapular rash most prominent on the neck Psychiatric: Normal mood and affect   RESULTS  Summary of this visit's results, reviewed by myself:   EKG Interpretation  Date/Time:    Ventricular Rate:    PR Interval:    QRS Duration:   QT Interval:    QTC Calculation:   R Axis:     Text Interpretation:        Laboratory Studies: No results found for this or any previous visit (from the past 24 hour(s)). Imaging Studies: No results found.  ED COURSE  Nursing notes and initial vitals signs, including pulse oximetry, reviewed.  Vitals:   02/13/16 1935  BP: 124/75  Pulse: 82  Resp: 18  Temp: 98.2 F (36.8 C)  TempSrc: Oral  SpO2: 96%  Weight: 158 lb (71.7 kg)  Height: 5\' 2"  (1.575 m)    PROCEDURES    ED DIAGNOSES     ICD-9-CM ICD-10-CM   1. Contact dermatitis due to detergent, unspecified contact dermatitis type 692.0 L24.0        Paula LibraJohn Natalynn Pedone, MD 02/13/16 2307

## 2016-03-15 ENCOUNTER — Ambulatory Visit (INDEPENDENT_AMBULATORY_CARE_PROVIDER_SITE_OTHER): Payer: Managed Care, Other (non HMO) | Admitting: Family Medicine

## 2016-03-15 ENCOUNTER — Encounter: Payer: Self-pay | Admitting: Family Medicine

## 2016-03-15 ENCOUNTER — Other Ambulatory Visit: Payer: Self-pay | Admitting: Emergency Medicine

## 2016-03-15 VITALS — BP 122/70 | HR 81 | Temp 98.1°F | Ht 62.5 in | Wt 166.2 lb

## 2016-03-15 DIAGNOSIS — R112 Nausea with vomiting, unspecified: Secondary | ICD-10-CM

## 2016-03-15 DIAGNOSIS — Z0000 Encounter for general adult medical examination without abnormal findings: Secondary | ICD-10-CM

## 2016-03-15 DIAGNOSIS — K219 Gastro-esophageal reflux disease without esophagitis: Secondary | ICD-10-CM

## 2016-03-15 DIAGNOSIS — R7303 Prediabetes: Secondary | ICD-10-CM | POA: Insufficient documentation

## 2016-03-15 DIAGNOSIS — Z130 Encounter for screening for diseases of the blood and blood-forming organs and certain disorders involving the immune mechanism: Secondary | ICD-10-CM

## 2016-03-15 DIAGNOSIS — E876 Hypokalemia: Secondary | ICD-10-CM

## 2016-03-15 DIAGNOSIS — E559 Vitamin D deficiency, unspecified: Secondary | ICD-10-CM | POA: Diagnosis not present

## 2016-03-15 DIAGNOSIS — I10 Essential (primary) hypertension: Secondary | ICD-10-CM

## 2016-03-15 HISTORY — DX: Gastro-esophageal reflux disease without esophagitis: K21.9

## 2016-03-15 LAB — CBC
HEMATOCRIT: 37.2 % (ref 36.0–46.0)
Hemoglobin: 12.6 g/dL (ref 12.0–15.0)
MCHC: 34 g/dL (ref 30.0–36.0)
MCV: 89.9 fl (ref 78.0–100.0)
Platelets: 364 10*3/uL (ref 150.0–400.0)
RBC: 4.14 Mil/uL (ref 3.87–5.11)
RDW: 13.6 % (ref 11.5–15.5)
WBC: 7.6 10*3/uL (ref 4.0–10.5)

## 2016-03-15 LAB — COMPREHENSIVE METABOLIC PANEL
ALT: 56 U/L — AB (ref 0–35)
AST: 16 U/L (ref 0–37)
Albumin: 4.2 g/dL (ref 3.5–5.2)
Alkaline Phosphatase: 116 U/L (ref 39–117)
BILIRUBIN TOTAL: 0.2 mg/dL (ref 0.2–1.2)
BUN: 18 mg/dL (ref 6–23)
CALCIUM: 9.2 mg/dL (ref 8.4–10.5)
CO2: 32 meq/L (ref 19–32)
CREATININE: 0.96 mg/dL (ref 0.40–1.20)
Chloride: 103 mEq/L (ref 96–112)
GFR: 75.44 mL/min (ref >60.00)
GLUCOSE: 108 mg/dL — AB (ref 70–99)
Potassium: 3.1 mEq/L — ABNORMAL LOW (ref 3.5–5.1)
SODIUM: 138 meq/L (ref 135–145)
Total Protein: 7.4 g/dL (ref 6.0–8.3)

## 2016-03-15 LAB — HEMOGLOBIN A1C: Hgb A1c MFr Bld: 6.2 % (ref 4.6–6.5)

## 2016-03-15 MED ORDER — RANITIDINE HCL 150 MG PO CAPS: 150.0000 mg | ORAL_CAPSULE | Freq: Two times a day (BID) | ORAL | 5 refills | Status: DC

## 2016-03-15 MED ORDER — METOCLOPRAMIDE HCL 10 MG PO TABS: 10.0000 mg | ORAL_TABLET | Freq: Every day | ORAL | 3 refills | Status: DC | PRN

## 2016-03-15 NOTE — Progress Notes (Addendum)
[facility] [address], [address] [place], [place] [address] [phone] [phone]  Date:  03/15/2016   Name:  Stephanie PostJanet Phillips   DOB:  16-May-1950   MRN:  [id number]  PCP:  Abbe AmsterdamOPLAND,Marice Angelino, MD    Chief Complaint: Annual Exam (Pt here for CPE and is fasting. Declined flu vaccine. )   History of Present Illness:  Stephanie Phillips is a 66 y.o. very pleasant female patient who presents with the following:  Last seen here in May with the following HPI:  Her BP is well controlled on her current medicatoin She has not noted any SE of medication No CP, no SOB She started the fosamax just this year for osteoporosis She is taking 50k of vitamin D twice a week- she has been taking this for a few months   She is "not much into vaccines-" did agree to have a tdap today however.  Last tetanus over 10 years ago  Here today for a CPE.  She is feeling well but does have a few concerns, mostly about her GI system No recent changes in her health per her report Last pap: last done approx one year ago, she does see OBG.  Would like for us to take over her paps as she does not otherwise need OBG care as this time mammo- last year [facility]  [facility] They also rx her omeprazole and reglan for her in the past.  She is now out of these medications but would like to go back on reglan and something for GERD if she can  She is doing fine taking foasamax - no SE here She does not always take her vitamin D She is taking Kcl 10 some of the time but not every day. She is taking HCTZ 25 daily for her BP.  Tries to get plenty of dietary potassium She did take omeprazole in the past but has stopped using this.  She does have more reflux since she stopped using this She has a history of nausea and has used reglan prn for approx 20 years. She generally takes it once a day She may vomit once a week, up to a few times a month.     Patient Active Problem List    Diagnosis Date Noted  . Osteoporosis 06/16/2015  . Left knee pain 04/14/2015  . Left foot pain 04/14/2015  . Jones fracture 06/24/2013  . Benign hypertension 04/30/2013  . Decreased potassium in the blood 04/30/2013  . Current tobacco use 11/04/2012    Past Medical History:  Diagnosis Date  . Arthritis   . Hypertension   . Osteoporosis 06/16/2015    Past Surgical History:  Procedure Laterality Date  . TONSILLECTOMY      Social History  Substance Use Topics  . Smoking status: Current Every Day Smoker    Packs/day: 1.00    Types: Cigarettes  . Smokeless tobacco: Never Used  . Alcohol use 0.0 oz/week    Family History  Problem Relation Age of Onset  . Hypertension Mother   . Hypertension Father     No Known Allergies  Medication list has been reviewed and updated.  Current Outpatient Prescriptions on File Prior to Visit  Medication Sig Dispense Refill  . alendronate (FOSAMAX) 70 MG tablet TAKE 1 TAB WEEKLY IN AM ON EMPTY STOMACH WITH FULL GLASS OF WATER.DONT LIE DOWN OR EAT FOR 45 MIN.  3  . cetirizine (ZYRTEC) 10 MG tablet Take  1 tablet (10 mg total) by mouth at bedtime as needed (for itching).    . hydrochlorothiazide (HYDRODIURIL) 25 MG tablet Take 1 tablet (25 mg total) by mouth daily. 90 tablet 3  . methylPREDNISolone (MEDROL DOSEPAK) 4 MG TBPK tablet Take tapering dose per package instructions. 21 tablet 0   No current facility-administered medications on file prior to visit.     Review of Systems:  As per HPI- otherwise negative. No fever, chills, nausea, vomiting, diarrhea, rash, CP, SOB, ST or cough    Physical Examination: Vitals:   03/15/16 0947  BP: 122/70  Pulse: 81  Temp: 98.1 F (36.7 C)   Vitals:   03/15/16 0947  Weight: 166 lb 3.2 oz (75.4 kg)  Height: 5' 2.5" (1.588 m)   Body mass index is 29.91 kg/m. Ideal Body Weight: Weight in (lb) to have BMI = 25: 138.6 GEN: WDWN, NAD, Non-toxic, A & O x 3, overweight, otherwise looks  well HEENT: Atraumatic, Normocephalic. Neck supple. No masses, No LAD.  Bilateral TM wnl, oropharynx normal.  PEERL,EOMI.   Ears and Nose: No external deformity. CV: RRR, No M/G/R. No JVD. No thrill. No extra heart sounds. PULM: CTA B, no wheezes, crackles, rhonchi. No retractions. No resp. distress. No accessory muscle use. ABD: S, NT, ND EXTR: No c/c/e NEURO Normal gait.  PSYCH: Normally interactive. Conversant. Not depressed or anxious appearing.  Calm demeanor.    Assessment and Plan: Physical exam  Non-intractable vomiting with nausea, unspecified vomiting type - Plan: metoCLOPramide (REGLAN) 10 MG tablet  Essential hypertension - Plan: Comprehensive metabolic panel  Pre-diabetes - Plan: Hemoglobin A1c  Vitamin D deficiency - Plan: Vitamin D (25 hydroxy)  Screening for deficiency anemia - Plan: CBC  Gastroesophageal reflux disease, esophagitis presence not specified - Plan: ranitidine (ZANTAC) 150 MG capsule  Here today for a CPE Labs pending as above I have requested her colonoscopy report and GI notes from [place]- will review when received For the time being refilled her reglan to use once a day as needed for nausea. Will have her start on zantac which may be safer than a PPI for long term use Check her vitamin D level and K level today Last A1c showed pre-diabetes Lab Results  Component Value Date   HGBA1C 6.1 06/16/2015   Will repeat for her today  Signed Abbe Amsterdam, MD  Labs as below, 2/4.  Called her to discuss A1c still in pre-diabetes range She will start vitamin D OTC, 2000 IU daily Her K is low- will change to lisinopril/hctz 10/12.5,  She will take her Kcl daily for one week.  Come in for labs visit only in one week to check K.  She states understanding   Results for orders placed or performed in visit on 03/15/16  Comprehensive metabolic panel  Result Value Ref Range   Sodium 138 135 - 145 mEq/L   Potassium 3.1 (L) 3.5 - 5.1 mEq/L   Chloride  103 96 - 112 mEq/L   CO2 32 19 - 32 mEq/L   Glucose, Bld 108 (H) 70 - 99 mg/dL   BUN 18 6 - 23 mg/dL   Creatinine, Ser 8.11 0.40 - 1.20 mg/dL   Total Bilirubin 0.2 0.2 - 1.2 mg/dL   Alkaline Phosphatase 116 39 - 117 U/L   AST 16 0 - 37 U/L   ALT 56 (H) 0 - 35 U/L   Total Protein 7.4 6.0 - 8.3 g/dL   Albumin 4.2 3.5 - 5.2 g/dL  Calcium 9.2 8.4 - 10.5 mg/dL   GFR 24.40 >10.27 mL/min  Hemoglobin A1c  Result Value Ref Range   Hgb A1c MFr Bld 6.2 4.6 - 6.5 %  CBC  Result Value Ref Range   WBC 7.6 4.0 - 10.5 K/uL   RBC 4.14 3.87 - 5.11 Mil/uL   Platelets 364.0 150.0 - 400.0 K/uL   Hemoglobin 12.6 12.0 - 15.0 g/dL   HCT 25.3 66.4 - 40.3 %   MCV 89.9 78.0 - 100.0 fl   MCHC 34.0 30.0 - 36.0 g/dL   RDW 47.4 25.9 - 56.3 %  Vitamin D (25 hydroxy)  Result Value Ref Range   VITD 25.53 (L) 30.00 - 100.00 ng/mL

## 2016-03-15 NOTE — Patient Instructions (Signed)
It was very nice to see you today- we will get your labs and I will be in touch with your results We will also work on getting your [facility] your [facility] and GI visits For your nausea, you can continue to use reglan as needed We will try ranitidine for your GERD symptoms- this may be safer than using omeprazole long term

## 2016-03-15 NOTE — Progress Notes (Unsigned)
Pre visit review using our clinic review tool, if applicable. No additional management support is needed unless otherwise documented below in the visit note. 

## 2016-03-16 LAB — VITAMIN D 25 HYDROXY (VIT D DEFICIENCY, FRACTURES): VITD: 25.53 ng/mL — ABNORMAL LOW (ref 30.00–100.00)

## 2016-03-18 ENCOUNTER — Encounter: Payer: Self-pay | Admitting: Family Medicine

## 2016-03-18 MED ORDER — LISINOPRIL-HYDROCHLOROTHIAZIDE 20-12.5 MG PO TABS: 1.0000 | ORAL_TABLET | Freq: Every day | ORAL | 3 refills | Status: DC

## 2016-03-18 NOTE — Addendum Note (Signed)
Addended by: Abbe AmsterdamOPLAND, Rahman Ferrall C on: 03/18/2016 12:45 PM   Modules accepted: Orders

## 2016-04-02 ENCOUNTER — Ambulatory Visit (INDEPENDENT_AMBULATORY_CARE_PROVIDER_SITE_OTHER): Payer: Managed Care, Other (non HMO) | Admitting: Family Medicine

## 2016-04-02 VITALS — BP 127/78 | HR 64 | Temp 98.2°F | Ht 63.0 in | Wt 166.2 lb

## 2016-04-02 DIAGNOSIS — E876 Hypokalemia: Secondary | ICD-10-CM

## 2016-04-02 DIAGNOSIS — I10 Essential (primary) hypertension: Secondary | ICD-10-CM | POA: Diagnosis not present

## 2016-04-02 LAB — BASIC METABOLIC PANEL
BUN: 9 mg/dL (ref 6–23)
CHLORIDE: 106 meq/L (ref 96–112)
CO2: 29 meq/L (ref 19–32)
Calcium: 9.1 mg/dL (ref 8.4–10.5)
Creatinine, Ser: 0.86 mg/dL (ref 0.40–1.20)
GFR: 85.64 mL/min (ref >60.00)
Glucose, Bld: 98 mg/dL (ref 70–99)
Potassium: 3.1 mEq/L — ABNORMAL LOW (ref 3.5–5.1)
SODIUM: 142 meq/L (ref 135–145)

## 2016-04-02 NOTE — Patient Instructions (Signed)
It was great to see you - we will check your potassium today. Assuming your potassium is back up we can change your medication to losartan/ hctz in hopes of resolving your cough

## 2016-04-02 NOTE — Progress Notes (Addendum)
[facility] [address] [place], [place] [address] [phone] [phone]  Date:  04/02/2016   Name:  Stephanie PostJanet Phillips   DOB:  Apr 23, 1950   MRN:  [id number]  PCP:  Abbe AmsterdamOPLAND,Samaya Boardley, MD    Chief Complaint: Follow-up (Pt here for med f/u. Pt started Lisinopril-HCTZ, Metoclopramide, and RaNITidine last visit and tolerating well per pt. Declined flu vaccine. )   History of Present Illness:  Stephanie PostJanet Phillips is a 66 y.o. very pleasant female patient who presents with the following:  Here today for a follow-up visit Seen here earlier this month- 2/1- we started her on omeprazole at that visit  We changed her BP med from 25 of HCTZ to prinzide 10/12.5 due to hypokalemia- she has not noted any SE of this change except she has noted a cough- had not realized that this could be related  We put her on omeprazole at our last visit and this is working well for her ands is controlling her GERD She is also on reglan  Patient Active Problem List   Diagnosis Date Noted  . Pre-diabetes 03/15/2016  . Vitamin D deficiency 03/15/2016  . Gastroesophageal reflux disease 03/15/2016  . Non-intractable vomiting with nausea 03/15/2016  . Osteoporosis 06/16/2015  . Jones fracture 06/24/2013  . Benign hypertension 04/30/2013  . Decreased potassium in the blood 04/30/2013  . Current tobacco use 11/04/2012    Past Medical History:  Diagnosis Date  . Arthritis   . Gastroesophageal reflux disease 03/15/2016  . Hypertension   . Osteoporosis 06/16/2015    Past Surgical History:  Procedure Laterality Date  . TONSILLECTOMY      Social History  Substance Use Topics  . Smoking status: Current Every Day Smoker    Packs/day: 1.00    Types: Cigarettes  . Smokeless tobacco: Never Used  . Alcohol use 0.0 oz/week    Family History  Problem Relation Age of Onset  . Hypertension Mother   . Hypertension Father     No Known Allergies  Medication list has  been reviewed and updated.  Current Outpatient Prescriptions on File Prior to Visit  Medication Sig Dispense Refill  . alendronate (FOSAMAX) 70 MG tablet TAKE 1 TAB WEEKLY IN AM ON EMPTY STOMACH WITH FULL GLASS OF WATER.DONT LIE DOWN OR EAT FOR 45 MIN.  3  . cetirizine (ZYRTEC) 10 MG tablet Take 1 tablet (10 mg total) by mouth at bedtime as needed (for itching).    Marland Kitchen. lisinopril-hydrochlorothiazide (ZESTORETIC) 20-12.5 MG tablet Take 1 tablet by mouth daily. 90 tablet 3  . metoCLOPramide (REGLAN) 10 MG tablet Take 1 tablet (10 mg total) by mouth daily as needed for nausea. 90 tablet 3  . potassium chloride (K-DUR) 10 MEQ tablet Take 10 mEq by mouth daily.    . ranitidine (ZANTAC) 150 MG capsule Take 1 capsule (150 mg total) by mouth 2 (two) times daily. Use as needed for GERD 60 capsule 5   No current facility-administered medications on file prior to visit.     Review of Systems:  As per HPI- otherwise negative. She did stop taking her K as instructed No fever, chills, rash, ST, CP or SOB   Physical Examination: Vitals:   04/02/16 1010  BP: 127/78  Pulse: 64  Temp: 98.2 F (36.8 C)   Vitals:   04/02/16 1010  Weight: 166 lb 3.2 oz (75.4 kg)  Height: 5\' 3"  (1.6 m)   Body mass index is 29.44  kg/m. Ideal Body Weight: Weight in (lb) to have BMI = 25: 140.8 GEN: WDWN, NAD, Non-toxic, A & O x 3, looks well, overweight HEENT: Atraumatic, Normocephalic. Neck supple. No masses, No LAD. Ears and Nose: No external deformity. CV: RRR, No M/G/R. No JVD. No thrill. No extra heart sounds. PULM: CTA B, no wheezes, crackles, rhonchi. No retractions. No resp. distress. No accessory muscle use. EXTR: No c/c/e NEURO Normal gait.  PSYCH: Normally interactive. Conversant. Not depressed or anxious appearing.  Calm demeanor.     Assessment and Plan: Hypokalemia - Plan: Basic metabolic panel  Essential hypertension - Plan: Basic metabolic panel  Her PB looks better today, but she has  developed a cough Will plan to change to losartan/hctz if her K is back to normal Will plan further follow- up pending labs.   Signed Abbe Amsterdam, MD  Received her labs 2/23- gave her a call.  Will change to plain losartan 100 mg and she will come in for a K level in about one month.  Can stop K supplement as we are stopping diuretic. However please do get 2-3 servings of high K foods daily (look for a list online)   Results for orders placed or performed in visit on 04/02/16  Basic metabolic panel  Result Value Ref Range   Sodium 142 135 - 145 mEq/L   Potassium 3.1 (L) 3.5 - 5.1 mEq/L   Chloride 106 96 - 112 mEq/L   CO2 29 19 - 32 mEq/L   Glucose, Bld 98 70 - 99 mg/dL   BUN 9 6 - 23 mg/dL   Creatinine, Ser 4.09 0.40 - 1.20 mg/dL   Calcium 9.1 8.4 - 81.1 mg/dL   GFR 91.47 >82.95 mL/min

## 2016-04-02 NOTE — Progress Notes (Signed)
Pre visit review using our clinic review tool, if applicable. No additional management support is needed unless otherwise documented below in the visit note. 

## 2016-04-06 ENCOUNTER — Encounter: Payer: Self-pay | Admitting: Family Medicine

## 2016-04-06 MED ORDER — LOSARTAN POTASSIUM 50 MG PO TABS
50.0000 mg | ORAL_TABLET | Freq: Every day | ORAL | 6 refills | Status: DC
Start: 2016-04-06 — End: 2016-11-29

## 2016-04-06 NOTE — Addendum Note (Signed)
Addended by: Abbe AmsterdamOPLAND, Wilbern Pennypacker C on: 04/06/2016 10:12 AM   Modules accepted: Orders

## 2016-05-06 NOTE — Progress Notes (Addendum)
[facility] [address] [place], [place] [address] [phone] [phone]  Date:  05/07/2016   Name:  Stephanie PostJanet Phillips   DOB:  04/09/1950   MRN:  [id number]  PCP:  Abbe AmsterdamOPLAND,Owyn Raulston, MD    Chief Complaint: Follow-up   History of Present Illness:  Stephanie Phillips is a 66 y.o. very pleasant female patient who presents with the following: Here today to follow-up on her BP control and electrolytes Changed from HCTZ to losartan as she had developed hypokalemia on her diuretic   Chemistry      Component Value Date/Time   NA 142 04/02/2016 1027   K 3.1 (L) 04/02/2016 1027   CL 106 04/02/2016 1027   CO2 29 04/02/2016 1027   BUN 9 04/02/2016 1027   CREATININE 0.86 04/02/2016 1027      Component Value Date/Time   CALCIUM 9.1 04/02/2016 1027   ALKPHOS 116 03/15/2016 1009   AST 16 03/15/2016 1009   ALT 56 (H) 03/15/2016 1009   BILITOT 0.2 03/15/2016 1009     Needs to repeat her CMP today History of pre-diabetes, HTN, osteoporosis   She has not been checking her BP at home She has not noted any se of the medication- cough that she had with ACE has resolved with change to ARB No muscle cramps, no LE edema  She has noted a skin tag on her right trunk which is irritating and catches on her bra- would like this to be removed if possible today   BP Readings from Last 3 Encounters:  05/07/16 (!) 174/88  04/02/16 127/78  03/15/16 122/70     Patient Active Problem List   Diagnosis Date Noted  . Pre-diabetes 03/15/2016  . Vitamin D deficiency 03/15/2016  . Gastroesophageal reflux disease 03/15/2016  . Non-intractable vomiting with nausea 03/15/2016  . Osteoporosis 06/16/2015  . Jones fracture 06/24/2013  . Benign hypertension 04/30/2013  . Decreased potassium in the blood 04/30/2013  . Current tobacco use 11/04/2012    Past Medical History:  Diagnosis Date  . Arthritis   . Gastroesophageal reflux disease 03/15/2016  .  Hypertension   . Osteoporosis 06/16/2015    Past Surgical History:  Procedure Laterality Date  . TONSILLECTOMY      Social History  Substance Use Topics  . Smoking status: Current Every Day Smoker    Packs/day: 1.00    Types: Cigarettes  . Smokeless tobacco: Never Used  . Alcohol use 0.0 oz/week    Family History  Problem Relation Age of Onset  . Hypertension Mother   . Hypertension Father     No Known Allergies  Medication list has been reviewed and updated.  Current Outpatient Prescriptions on File Prior to Visit  Medication Sig Dispense Refill  . alendronate (FOSAMAX) 70 MG tablet TAKE 1 TAB WEEKLY IN AM ON EMPTY STOMACH WITH FULL GLASS OF WATER.DONT LIE DOWN OR EAT FOR 45 MIN.  3  . cetirizine (ZYRTEC) 10 MG tablet Take 1 tablet (10 mg total) by mouth at bedtime as needed (for itching).    Marland Kitchen. losartan (COZAAR) 50 MG tablet Take 1 tablet (50 mg total) by mouth daily. 30 tablet 6  . metoCLOPramide (REGLAN) 10 MG tablet Take 1 tablet (10 mg total) by mouth daily as needed for nausea. 90 tablet 3  . ranitidine (ZANTAC) 150 MG capsule Take 1 capsule (150 mg total) by mouth 2 (two) times daily. Use as needed for GERD 60 capsule  5   No current facility-administered medications on file prior to visit.     Review of Systems:  As per HPI- otherwise negative.   Physical Examination: Vitals:   05/07/16 1006  BP: (!) 174/88  Pulse: 83  Temp: 98.2 F (36.8 C)   Vitals:   05/07/16 1006  Weight: 164 lb 3.2 oz (74.5 kg)  Height: 5\' 3"  (1.6 m)   Body mass index is 29.09 kg/m. Ideal Body Weight: Weight in (lb) to have BMI = 25: 140.8  GEN: WDWN, NAD, Non-toxic, A & O x 3 HEENT: Atraumatic, Normocephalic. Neck supple. No masses, No LAD. Ears and Nose: No external deformity. CV: RRR, No M/G/R. No JVD. No thrill. No extra heart sounds. PULM: CTA B, no wheezes, crackles, rhonchi. No retractions. No resp. distress. No accessory muscle use. ABD: S, NT, ND, +BS. No rebound.  No HSM. EXTR: No c/c/e NEURO Normal gait.  PSYCH: Normally interactive. Conversant. Not depressed or anxious appearing.  Calm demeanor.  Tiny skin tag on right trunk at bra line  VC obtained.  Prepped skin tag with alcohol pad x2.  Removed with scissors and dressed with band-aid    Assessment and Plan: Hypokalemia - Plan: Comprehensive metabolic panel  Essential hypertension  Pre-diabetes  Skin tag  Here today for a follow-up visit.  Discussed changing her to losartan- 100 mg she would prefer to check on her BP at home a few times however prior to making this change.  This is fine, she will update me in 1-2 weeks with some BP readings.   Will check her K level today Removed skin tag Will monitor her glucose today as well- noted to have pre-diabetes on recent A1c   Signed Abbe Amsterdam, MD  Received her labs 3/27  Results for orders placed or performed in visit on 05/07/16  Comprehensive metabolic panel  Result Value Ref Range   Sodium 141 135 - 145 mEq/L   Potassium 3.2 (L) 3.5 - 5.1 mEq/L   Chloride 109 96 - 112 mEq/L   CO2 29 19 - 32 mEq/L   Glucose, Bld 101 (H) 70 - 99 mg/dL   BUN 12 6 - 23 mg/dL   Creatinine, Ser 1.61 0.40 - 1.20 mg/dL   Total Bilirubin 0.2 0.2 - 1.2 mg/dL   Alkaline Phosphatase 81 39 - 117 U/L   AST 13 0 - 37 U/L   ALT 9 0 - 35 U/L   Total Protein 6.7 6.0 - 8.3 g/dL   Albumin 3.9 3.5 - 5.2 g/dL   Calcium 9.1 8.4 - 09.6 mg/dL   GFR 04.54 >09.81 mL/min   Her K is still low, but slightly improved.  Will ask her to increase dietary potassium and come in for recheck in 4-6 weeks

## 2016-05-07 ENCOUNTER — Ambulatory Visit (INDEPENDENT_AMBULATORY_CARE_PROVIDER_SITE_OTHER): Payer: Managed Care, Other (non HMO) | Admitting: Family Medicine

## 2016-05-07 VITALS — BP 160/90 | HR 83 | Temp 98.2°F | Ht 63.0 in | Wt 164.2 lb

## 2016-05-07 DIAGNOSIS — E876 Hypokalemia: Secondary | ICD-10-CM | POA: Diagnosis not present

## 2016-05-07 DIAGNOSIS — I10 Essential (primary) hypertension: Secondary | ICD-10-CM | POA: Diagnosis not present

## 2016-05-07 DIAGNOSIS — L918 Other hypertrophic disorders of the skin: Secondary | ICD-10-CM

## 2016-05-07 DIAGNOSIS — R7303 Prediabetes: Secondary | ICD-10-CM | POA: Diagnosis not present

## 2016-05-07 LAB — COMPREHENSIVE METABOLIC PANEL
ALBUMIN: 3.9 g/dL (ref 3.5–5.2)
ALK PHOS: 81 U/L (ref 39–117)
ALT: 9 U/L (ref 0–35)
AST: 13 U/L (ref 0–37)
BUN: 12 mg/dL (ref 6–23)
CALCIUM: 9.1 mg/dL (ref 8.4–10.5)
CO2: 29 mEq/L (ref 19–32)
Chloride: 109 mEq/L (ref 96–112)
Creatinine, Ser: 0.84 mg/dL (ref 0.40–1.20)
GFR: 87.97 mL/min (ref >60.00)
Glucose, Bld: 101 mg/dL — ABNORMAL HIGH (ref 70–99)
POTASSIUM: 3.2 meq/L — AB (ref 3.5–5.1)
Sodium: 141 mEq/L (ref 135–145)
Total Bilirubin: 0.2 mg/dL (ref 0.2–1.2)
Total Protein: 6.7 g/dL (ref 6.0–8.3)

## 2016-05-07 NOTE — Patient Instructions (Signed)
Please check your BP at home a few times and contact me with some readings in 1-2 weeks. If your BP is running higher than 140/90 on a regular basis we should increase your dose of losartan  We will check your potassium today and I will let you know how things look.    We removed a skin tag from your right side today- please leave it clean and dry today, tomorrow you can shower like usual.  Let me know if any sign of infection such as redness or swelling

## 2016-05-08 ENCOUNTER — Emergency Department (HOSPITAL_BASED_OUTPATIENT_CLINIC_OR_DEPARTMENT_OTHER)
Admission: EM | Admit: 2016-05-08 | Discharge: 2016-05-08 | Disposition: A | Discharge status: Home / Self Care | Payer: Managed Care, Other (non HMO) | Attending: Physician Assistant | Admitting: Physician Assistant

## 2016-05-08 ENCOUNTER — Encounter (HOSPITAL_BASED_OUTPATIENT_CLINIC_OR_DEPARTMENT_OTHER): Payer: Self-pay

## 2016-05-08 DIAGNOSIS — I158 Other secondary hypertension: Secondary | ICD-10-CM | POA: Diagnosis not present

## 2016-05-08 DIAGNOSIS — F17210 Nicotine dependence, cigarettes, uncomplicated: Secondary | ICD-10-CM | POA: Diagnosis not present

## 2016-05-08 DIAGNOSIS — I10 Essential (primary) hypertension: Secondary | ICD-10-CM | POA: Diagnosis present

## 2016-05-08 NOTE — ED Provider Notes (Signed)
[facility] Provider Note   CSN: [id number] Arrival date & time: 05/08/16  1511     History   Chief Complaint Chief Complaint  Patient presents with  . Hypertension    HPI Stephanie Phillips is a 66 y.o. female.  HPI   Patient is a 66 year old female currently working on controlling her blood pressure with her primary care physician. She went to primary care physician yesterday. According to the notes the plan was to increase her losartan to 100 mg. Patient reports that she was kind of unsure of the plan when she left yesterday but felt a little swimming headed and did not want to go to work tonight so called PCP about symtpoms and was told  to arrive here in the emergency department.  She has no headache, chest pain, other concerns.  Past Medical History:  Diagnosis Date  . Arthritis   . Gastroesophageal reflux disease 03/15/2016  . Hypertension   . Osteoporosis 06/16/2015    Patient Active Problem List   Diagnosis Date Noted  . Pre-diabetes 03/15/2016  . Vitamin D deficiency 03/15/2016  . Gastroesophageal reflux disease 03/15/2016  . Non-intractable vomiting with nausea 03/15/2016  . Osteoporosis 06/16/2015  . Jones fracture 06/24/2013  . Benign hypertension 04/30/2013  . Decreased potassium in the blood 04/30/2013  . Current tobacco use 11/04/2012    Past Surgical History:  Procedure Laterality Date  . TONSILLECTOMY      OB History    No data available       Home Medications    Prior to Admission medications   Medication Sig Start Date End Date Taking? Authorizing Provider  alendronate (FOSAMAX) 70 MG tablet TAKE 1 TAB WEEKLY IN AM ON EMPTY STOMACH WITH FULL GLASS OF WATER.DONT LIE DOWN OR EAT FOR 45 MIN. 02/23/15   Historical Provider, MD  cetirizine (ZYRTEC) 10 MG tablet Take 1 tablet (10 mg total) by mouth at bedtime as needed (for itching). 02/13/16   John Molpus, MD  losartan (COZAAR) 50 MG tablet Take 1 tablet (50 mg total) by mouth daily.  04/06/16   Pearline CablesJessica C Copland, MD  metoCLOPramide (REGLAN) 10 MG tablet Take 1 tablet (10 mg total) by mouth daily as needed for nausea. 03/15/16   Gwenlyn FoundJessica C Copland, MD  ranitidine (ZANTAC) 150 MG capsule Take 1 capsule (150 mg total) by mouth 2 (two) times daily. Use as needed for GERD 03/15/16   Pearline CablesJessica C Copland, MD    Family History Family History  Problem Relation Age of Onset  . Hypertension Mother   . Hypertension Father     Social History Social History  Substance Use Topics  . Smoking status: Current Every Day Smoker    Packs/day: 1.00    Types: Cigarettes  . Smokeless tobacco: Never Used  . Alcohol use 0.0 oz/week     Allergies   Patient has no known allergies.   Review of Systems Review of Systems  Constitutional: Negative for activity change.  Respiratory: Negative for shortness of breath.   Cardiovascular: Negative for chest pain.  Gastrointestinal: Negative for abdominal pain.  Neurological: Negative for dizziness, syncope, weakness, light-headedness and headaches.  All other systems reviewed and are negative.    Physical Exam Updated Vital Signs BP (!) 183/101 (BP Location: Right Arm) Comment: HX of hypertension  Pulse 68   Temp 98.2 F (36.8 C) (Oral)   Resp 18   Ht 5\' 2"  (1.575 m)   Wt 162 lb (73.5 kg)   SpO2 97%  BMI 29.63 kg/m   Physical Exam  Constitutional: She is oriented to person, place, and time. She appears well-developed and well-nourished.  HENT:  Head: Normocephalic and atraumatic.  Eyes: Right eye exhibits no discharge.  Cardiovascular: Normal rate, regular rhythm and normal heart sounds.   No murmur heard. Pulmonary/Chest: Effort normal and breath sounds normal. She has no wheezes. She has no rales.  Abdominal: Soft. She exhibits no distension. There is no tenderness.  Neurological: She is oriented to person, place, and time.  Skin: Skin is warm and dry. She is not diaphoretic.  Psychiatric: She has a normal mood and affect.    Nursing note and vitals reviewed.    ED Treatments / Results  Labs (all labs ordered are listed, but only abnormal results are displayed) Labs Reviewed - No data to display  EKG  EKG Interpretation None       Radiology No results found.  Procedures Procedures (including critical care time)  Medications Ordered in ED Medications - No data to display   Initial Impression / Assessment and Plan / ED Course  I have reviewed the triage vital signs and the nursing notes.  Pertinent labs & imaging results that were available during my care of the patient were reviewed by me and considered in my medical decision making (see chart for details).     Patient is well-appearing 66 year old female presenting with hypertension. 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 Patient was seen yesterday and told increased losartan 100 mg. She was kind of unsure of the plan. She had a little bit of swimming headedness and came here to emergency department after calling her PCP. Patient has a normal neurologic exam denies any headache at that time. We had a discussion about whether we would do a CT or not. Patient does not think she requires a CT at this time. She will increase her losartan at home 100 mg and follow up with primary care physician.   Final Clinical Impressions(s) / ED Diagnoses   Final diagnoses:  None    New Prescriptions New Prescriptions   No medications on file     Starsha Morning Randall An, MD 05/08/16 1818

## 2016-05-08 NOTE — ED Notes (Signed)
Pt reports that she saw PCP yesterday, considered changing medications then. Pt reports continued hypertension. Reports "swimmy head" feeling.

## 2016-05-08 NOTE — ED Triage Notes (Signed)
Pt c/o high blood pressure, saw PCP yesterday for the same and her medications were changed, pt denies any headache, denies chest pain, denies SOB, denies numbness or tingling or weakness

## 2016-05-08 NOTE — Discharge Instructions (Signed)
We discussed that he will increase her BP med 100 mg as per planned by your primary care physician. Please continue to take her blood pressure as an outpatient follow-up with her PCP in 2 weeks.

## 2016-05-15 NOTE — Progress Notes (Addendum)
[facility] at [facility] [address] [place], [place] [address] [phone]  Date:  05/16/2016   Name:  Stephanie Phillips   DOB:  03-Jan-1951   MRN:  [id number]  PCP:  Abbe Amsterdam, MD    Chief Complaint: Follow-up (Pt here for for BP f/u. Pt has bp log and has c/o elevated readings. )   History of Present Illness:  Stephanie Phillips is a 66 y.o. very pleasant female patient who presents with the following:  Seen by myself on 3/26 at which time we increased her losartan from 50 to 100 mg.  We had stopped her diuretic due to hypokalemia- however since stopping her HCTZ we have had a hard time getting her BP under control She then presented to the ER the next day as she was feeling "swimmy headed," they evaluated her and released to home  She brings [place] a list of home BP readings- her BP are still too high, some examples from the last few days include 158/93, 169/117, 156/100, 153/115 this am  However she reports that she actually only took the losartan 100 for a couple of days, and then decreased to 50 mg again as she thought the higher dose was making her BP high She has had HTN for years   She has tried to increase her dietary potassium to combat her hyponatremia- I had given her a list of high K foods that she is using  BP Readings from Last 3 Encounters:  05/16/16 (!) 176/100  05/08/16 (!) 183/107  05/07/16 (!) 160/90      Chemistry      Component Value Date/Time   NA 141 05/07/2016 1023   K 3.2 (L) 05/07/2016 1023   CL 109 05/07/2016 1023   CO2 29 05/07/2016 1023   BUN 12 05/07/2016 1023   CREATININE 0.84 05/07/2016 1023      Component Value Date/Time   CALCIUM 9.1 05/07/2016 1023   ALKPHOS 81 05/07/2016 1023   AST 13 05/07/2016 1023   ALT 9 05/07/2016 1023   BILITOT 0.2 05/07/2016 1023       Patient Active Problem List   Diagnosis Date Noted  . Pre-diabetes 03/15/2016  . Vitamin D deficiency 03/15/2016  .  Gastroesophageal reflux disease 03/15/2016  . Non-intractable vomiting with nausea 03/15/2016  . Osteoporosis 06/16/2015  . Jones fracture 06/24/2013  . Benign hypertension 04/30/2013  . Decreased potassium in the blood 04/30/2013  . Current tobacco use 11/04/2012    Past Medical History:  Diagnosis Date  . Arthritis   . Gastroesophageal reflux disease 03/15/2016  . Hypertension   . Osteoporosis 06/16/2015    Past Surgical History:  Procedure Laterality Date  . TONSILLECTOMY      Social History  Substance Use Topics  . Smoking status: Current Every Day Smoker    Packs/day: 1.00    Types: Cigarettes  . Smokeless tobacco: Never Used  . Alcohol use 0.0 oz/week    Family History  Problem Relation Age of Onset  . Hypertension Mother   . Hypertension Father     No Known Allergies  Medication list has been reviewed and updated.  Current Outpatient Prescriptions on File Prior to Visit  Medication Sig Dispense Refill  . alendronate (FOSAMAX) 70 MG tablet TAKE 1 TAB WEEKLY IN AM ON EMPTY STOMACH WITH FULL GLASS OF WATER.DONT LIE DOWN OR EAT FOR 45 MIN.  3  . cetirizine (ZYRTEC) 10 MG tablet Take 1 tablet (  10 mg total) by mouth at bedtime as needed (for itching).    Marland Kitchen losartan (COZAAR) 50 MG tablet Take 1 tablet (50 mg total) by mouth daily. 30 tablet 6  . metoCLOPramide (REGLAN) 10 MG tablet Take 1 tablet (10 mg total) by mouth daily as needed for nausea. 90 tablet 3  . ranitidine (ZANTAC) 150 MG capsule Take 1 capsule (150 mg total) by mouth 2 (two) times daily. Use as needed for GERD 60 capsule 5   No current facility-administered medications on file prior to visit.     Review of Systems:  As per HPI- otherwise negative.  No CP or SOB No fever or chills No nausea, vomiting, diarrhea or LE swelling  Physical Examination: Vitals:   05/16/16 1104  BP: (!) 176/100  Pulse: 71  Temp: 98 F (36.7 C)   Vitals:   05/16/16 1104  Weight: 162 lb 12.8 oz (73.8 kg)  Height:   (1.6 m)   Body mass index is 28.84 kg/m. Ideal Body Weight: Weight in (lb) to have BMI = 25: 140.8  GEN: WDWN, NAD, Non-toxic, A & O x 3, overweight, looks well HEENT: Atraumatic, Normocephalic. Neck supple. No masses, No LAD. Ears and Nose: No external deformity. CV: RRR, No M/G/R. No JVD. No thrill. No extra heart sounds. PULM: CTA B, no wheezes, crackles, rhonchi. No retractions. No resp. distress. No accessory muscle use. EXTR: No c/c/e NEURO Normal gait.  PSYCH: Normally interactive. Conversant. Not depressed or anxious appearing.  Calm demeanor.    Assessment and Plan: Essential hypertension  Hypokalemia - Plan: Basic metabolic panel  Here today to follow-up on hypokalemia and HTN We will try adding back 12.5 of hctz as this really seemed to do the best job controlling her BP However will also check a BMP today She will plan to contact me with some BP readings in about one week   Signed Abbe Amsterdam, MD  Received her BMP 4/7-  Called her.  She reports that she decided to stop losartan and just start taking HCTZ 25.  Asked her to please go back to losartan 50 and HCTZ 12.5, and have a BMP drawn in one week.  She agrees to do so Results for orders placed or performed in visit on 05/16/16  Basic metabolic panel  Result Value Ref Range   Sodium 142 135 - 145 mEq/L   Potassium 3.4 (L) 3.5 - 5.1 mEq/L   Chloride 107 96 - 112 mEq/L   CO2 31 19 - 32 mEq/L   Glucose, Bld 97 70 - 99 mg/dL   BUN 9 6 - 23 mg/dL   Creatinine, Ser 2.13 0.40 - 1.20 mg/dL   Calcium 9.3 8.4 - 08.6 mg/dL   GFR 57.84 >69.62 mL/min

## 2016-05-16 ENCOUNTER — Ambulatory Visit (INDEPENDENT_AMBULATORY_CARE_PROVIDER_SITE_OTHER): Payer: Managed Care, Other (non HMO) | Admitting: Family Medicine

## 2016-05-16 VITALS — BP 176/100 | HR 71 | Temp 98.0°F | Ht 63.0 in | Wt 162.8 lb

## 2016-05-16 DIAGNOSIS — E876 Hypokalemia: Secondary | ICD-10-CM | POA: Diagnosis not present

## 2016-05-16 DIAGNOSIS — I10 Essential (primary) hypertension: Secondary | ICD-10-CM | POA: Diagnosis not present

## 2016-05-16 LAB — BASIC METABOLIC PANEL
BUN: 9 mg/dL (ref 6–23)
CO2: 31 meq/L (ref 19–32)
Calcium: 9.3 mg/dL (ref 8.4–10.5)
Chloride: 107 mEq/L (ref 96–112)
Creatinine, Ser: 0.89 mg/dL (ref 0.40–1.20)
GFR: 82.28 mL/min (ref >60.00)
GLUCOSE: 97 mg/dL (ref 70–99)
Potassium: 3.4 mEq/L — ABNORMAL LOW (ref 3.5–5.1)
SODIUM: 142 meq/L (ref 135–145)

## 2016-05-16 NOTE — Patient Instructions (Signed)
Continue taking 50 mg of losartan, and add back a 1/2 of your hydrochlorothiazide pill (12.5 mg) Please continue to monitor your BP and let me know how it looks in a week or so.  We will check your potassium today

## 2016-05-19 NOTE — Addendum Note (Signed)
Addended by: Abbe Amsterdam C on: 05/19/2016 12:47 PM   Modules accepted: Orders

## 2016-06-11 ENCOUNTER — Other Ambulatory Visit: Payer: Self-pay | Admitting: Family Medicine

## 2016-06-11 DIAGNOSIS — I10 Essential (primary) hypertension: Secondary | ICD-10-CM

## 2016-06-12 ENCOUNTER — Other Ambulatory Visit: Payer: Self-pay | Admitting: Emergency Medicine

## 2016-06-12 MED ORDER — HYDROCHLOROTHIAZIDE 25 MG PO TABS: ORAL_TABLET | ORAL | 3 refills | Status: DC

## 2016-06-12 NOTE — Telephone Encounter (Signed)
Received refill request on HCTZ 25 MG tablet. Per OV note from 05/16/16 per was to continue Losartan 50 MG and add back 1/2 tab HCTZ (12.5MG ). Should patient still be on 1/2 tab of HCTZ or should it be a whole tab now. Please advise.

## 2016-06-28 ENCOUNTER — Other Ambulatory Visit: Payer: Self-pay | Admitting: Family Medicine

## 2016-06-28 DIAGNOSIS — I10 Essential (primary) hypertension: Secondary | ICD-10-CM

## 2016-09-26 ENCOUNTER — Other Ambulatory Visit: Payer: Self-pay | Admitting: Family Medicine

## 2016-09-26 DIAGNOSIS — I10 Essential (primary) hypertension: Secondary | ICD-10-CM

## 2016-09-26 NOTE — Telephone Encounter (Signed)
Pt is requesting refill on Klor-con 10 meq, last ov 05/2016. Please advise.

## 2016-09-26 NOTE — Telephone Encounter (Signed)
Called pt- no answer, LMOM.  I will refill her K but she is overdue for a BMP.  I ordered this for her- please come in for a blood draw in the next 1-2 weeks

## 2016-11-21 ENCOUNTER — Other Ambulatory Visit (INDEPENDENT_AMBULATORY_CARE_PROVIDER_SITE_OTHER): Payer: 59

## 2016-11-21 DIAGNOSIS — I10 Essential (primary) hypertension: Secondary | ICD-10-CM

## 2016-11-21 LAB — BASIC METABOLIC PANEL
BUN: 9 mg/dL (ref 6–23)
CHLORIDE: 102 meq/L (ref 96–112)
CO2: 33 meq/L — AB (ref 19–32)
Calcium: 9.6 mg/dL (ref 8.4–10.5)
Creatinine, Ser: 0.88 mg/dL (ref 0.40–1.20)
GFR: 83.23 mL/min (ref >60.00)
Glucose, Bld: 110 mg/dL — ABNORMAL HIGH (ref 70–99)
POTASSIUM: 3.3 meq/L — AB (ref 3.5–5.1)
SODIUM: 140 meq/L (ref 135–145)

## 2016-11-22 ENCOUNTER — Encounter: Payer: Self-pay | Admitting: Family Medicine

## 2016-11-29 ENCOUNTER — Ambulatory Visit (INDEPENDENT_AMBULATORY_CARE_PROVIDER_SITE_OTHER): Payer: 59 | Admitting: Family Medicine

## 2016-11-29 VITALS — BP 122/80 | HR 88 | Temp 98.3°F | Ht 63.0 in | Wt 150.0 lb

## 2016-11-29 DIAGNOSIS — I10 Essential (primary) hypertension: Secondary | ICD-10-CM

## 2016-11-29 DIAGNOSIS — Z720 Tobacco use: Secondary | ICD-10-CM

## 2016-11-29 MED ORDER — VARENICLINE TARTRATE 0.5 MG X 11 & 1 MG X 42 PO MISC: ORAL | 0 refills | Status: DC

## 2016-11-29 MED ORDER — POTASSIUM CHLORIDE ER 10 MEQ PO TBCR: 10.0000 meq | EXTENDED_RELEASE_TABLET | Freq: Every day | ORAL | 3 refills | Status: DC

## 2016-11-29 MED ORDER — VARENICLINE TARTRATE 1 MG PO TABS: 1.0000 mg | ORAL_TABLET | Freq: Two times a day (BID) | ORAL | 3 refills | Status: DC

## 2016-11-29 MED ORDER — HYDROCHLOROTHIAZIDE 25 MG PO TABS: 25.0000 mg | ORAL_TABLET | Freq: Every day | ORAL | 3 refills | Status: DC

## 2016-11-29 NOTE — Patient Instructions (Signed)
It was nice to see you today!  Take care and please see me in the spring for labs and a physical I gave you an rx for chantix to start at your convenience

## 2016-11-29 NOTE — Progress Notes (Signed)
[facility] at [facility] [address] [place], [place] [address] [phone] [phone]  Date:  11/29/2016   Name:  Stephanie PostJanet Phillips   DOB:  Nov 05, 1950   MRN:  [id number]  PCP:  Pearline Cablesopland, Jessica C, MD    Chief Complaint: Follow-up (Pt would like to discus bp medication. Declined flu vaccine. )   History of Present Illness:  Stephanie Phillips is a 66 y.o. very pleasant female patient who presents with the following:  Here today to discuss her BP BP Readings from Last 3 Encounters:  11/29/16 122/80  05/16/16 (!) 176/100  05/08/16 (!) 183/107   I saw her last in April-  She stopped taking losartan due to not liking the way it made her feel She is now taking hctz 25 and klorcon 10 meq daily She stopped taking reglan as well  She does check her BP at home- it may run 122/80-90 She would like to stop smoking- would like an rx for chantix.  She tried this in the past but did not stick with it Declines flu shot today mammo done this month Pap in the last year or two- she would like to do next month Never had an abnl She feels well- no HA, CP, or SOB No rash No cough, fever or chlls Patient Active Problem List   Diagnosis Date Noted  . Pre-diabetes 03/15/2016  . Vitamin D deficiency 03/15/2016  . Gastroesophageal reflux disease 03/15/2016  . Non-intractable vomiting with nausea 03/15/2016  . Osteoporosis 06/16/2015  . Jones fracture 06/24/2013  . Benign hypertension 04/30/2013  . Decreased potassium in the blood 04/30/2013  . Current tobacco use 11/04/2012    Past Medical History:  Diagnosis Date  . Arthritis   . Gastroesophageal reflux disease 03/15/2016  . Hypertension   . Osteoporosis 06/16/2015    Past Surgical History:  Procedure Laterality Date  . TONSILLECTOMY      Social History  Substance Use Topics  . Smoking status: Current Every Day Smoker    Packs/day: 1.00    Types: Cigarettes  . Smokeless tobacco: Never Used  .  Alcohol use 0.0 oz/week    Family History  Problem Relation Age of Onset  . Hypertension Mother   . Hypertension Father     No Known Allergies  Medication list has been reviewed and updated.  Current Outpatient Prescriptions on File Prior to Visit  Medication Sig Dispense Refill  . alendronate (FOSAMAX) 70 MG tablet TAKE 1 TAB WEEKLY IN AM ON EMPTY STOMACH WITH FULL GLASS OF WATER.DONT LIE DOWN OR EAT FOR 45 MIN.  3  . cetirizine (ZYRTEC) 10 MG tablet Take 1 tablet (10 mg total) by mouth at bedtime as needed (for itching).    . hydrochlorothiazide (HYDRODIURIL) 25 MG tablet Take 1/2 tablet by mouth daily (Patient taking differently: Take 25 mg by mouth daily. Take 1/2 tablet by mouth daily) 45 tablet 3  . KLOR-CON 10 10 MEQ tablet TAKE 1 TABLET BY MOUTH DAILY 90 tablet 0  . ranitidine (ZANTAC) 150 MG capsule Take 1 capsule (150 mg total) by mouth 2 (two) times daily. Use as needed for GERD 60 capsule 5  . losartan (COZAAR) 50 MG tablet Take 1 tablet (50 mg total) by mouth daily. (Patient not taking: Reported on 11/29/2016) 30 tablet 6   No current facility-administered medications on file prior to visit.     Review of Systems:  As per HPI- otherwise negative.   Physical  Examination: Vitals:   11/29/16 1820  BP: 122/80  Pulse: 88  Temp: 98.3 F (36.8 C)  SpO2: 98%   Vitals:   11/29/16 1820  Weight: 150 lb (68 kg)   Body mass index is 26.57 kg/m. Ideal Body Weight:    GEN: WDWN, NAD, Non-toxic, A & O x 3, looks well, mild overweight HEENT: Atraumatic, Normocephalic. Neck supple. No masses, No LAD. Ears and Nose: No external deformity. CV: RRR, No M/G/R. No JVD. No thrill. No extra heart sounds. PULM: CTA B, no wheezes, crackles, rhonchi. No retractions. No resp. distress. No accessory muscle use.Marland Kitchen EXTR: No c/c/e NEURO Normal gait.  PSYCH: Normally interactive. Conversant. Not depressed or anxious appearing.  Calm demeanor.    Assessment and Plan: Essential  hypertension - Plan: hydrochlorothiazide (HYDRODIURIL) 25 MG tablet, potassium chloride (KLOR-CON 10) 10 MEQ tablet  Tobacco abuse - Plan: varenicline (CHANTIX CONTINUING MONTH PAK) 1 MG tablet, varenicline (CHANTIX STARTING MONTH PAK) 0.5 MG X 11 & 1 MG X 42 tablet, DISCONTINUED: varenicline (CHANTIX STARTING MONTH PAK) 0.5 MG X 11 & 1 MG X 42 tablet  Here today to recheck BP- she changed her regiment to hctz 25.  Confirmed that she had been on this does for over a month when her last labs were drawn- minimally low K, not at dangerous level Encouraged her to eat high potassium foods daily and continue her klorcon Will recheck her BMP in about 4 months Asked her to come back in February for her CPE chantix rx- discussed how to use this medication and encouraged her to quit smoking    Signed Abbe Amsterdam, MD

## 2017-04-02 NOTE — Progress Notes (Addendum)
[facility] at [facility] [address] [place], [place] [address] [phone]  Date:  04/03/2017   Name:  Stephanie Phillips   DOB:  March 28, 1950   MRN:  [id number]  PCP:  Pearline Cables, MD    Chief Complaint: Annual Exam (Pt here for CPE with PAP.)   History of Present Illness:  Stephanie Phillips is a 67 y.o. very pleasant female patient who presents with the following:  Here today for a CPE History of vitamin D def, osteoporosis, HTN, pre-diabetes, smoker  I last saw her in October- she had changed her BP regimen a bit and was taking HCTZ 25, plus potassium, at that time  Here today to recheck BP- she changed her regiment to hctz 25.  Confirmed that she had been on this does for over a month when her last labs were drawn- minimally low K, not at dangerous level Encouraged her to eat high potassium foods daily and continue her klorcon Will recheck her BMP in about 4 months Asked her to come back in February for her CPE chantix rx- discussed how to use this medication and encouraged her to quit smoking    Pap: will do today, never had an abnl She is not sure of the date of her last pap  Mammo: 10/18 Dexa: this was done she thinks 2 years ago, it showed osteopenia  Labs: full labs due- she is fasting this morning, for 8 hours  Flu: she declines to have this done today Shingrix: Pneumovax: should have due to smoking  She did try taking chantix but she was not able to quit as of yet   She works at [employer] in the office.  Her job is physical- lots of walking  She also does walk her dog daily She will likely work another 2 years so she can retire with full pension  She is taking hctz 25 and kloncon 10 mew daily   Patient Active Problem List   Diagnosis Date Noted  . Pre-diabetes 03/15/2016  . Vitamin D deficiency 03/15/2016  . Gastroesophageal reflux disease 03/15/2016  . Non-intractable vomiting with nausea 03/15/2016  .  Osteoporosis 06/16/2015  . Jones fracture 06/24/2013  . Benign hypertension 04/30/2013  . Decreased potassium in the blood 04/30/2013  . Current tobacco use 11/04/2012    Past Medical History:  Diagnosis Date  . Arthritis   . Gastroesophageal reflux disease 03/15/2016  . Hypertension   . Osteoporosis 06/16/2015    Past Surgical History:  Procedure Laterality Date  . TONSILLECTOMY      Social History   Tobacco Use  . Smoking status: Current Every Day Smoker    Packs/day: 1.00    Types: Cigarettes  . Smokeless tobacco: Never Used  Substance Use Topics  . Alcohol use: Yes    Alcohol/week: 0.0 oz  . Drug use: No    Family History  Problem Relation Age of Onset  . Hypertension Mother   . Hypertension Father     No Known Allergies  Medication list has been reviewed and updated.  Current Outpatient Medications on File Prior to Visit  Medication Sig Dispense Refill  . alendronate (FOSAMAX) 70 MG tablet TAKE 1 TAB WEEKLY IN AM ON EMPTY STOMACH WITH FULL GLASS OF WATER.DONT LIE DOWN OR EAT FOR 45 MIN.  3  . cetirizine (ZYRTEC) 10 MG tablet Take 1 tablet (10 mg total) by mouth at bedtime as needed (for itching).    Marland Kitchen  potassium chloride (KLOR-CON 10) 10 MEQ tablet Take 1 tablet (10 mEq total) by mouth daily. 90 tablet 3  . ranitidine (ZANTAC) 150 MG capsule Take 1 capsule (150 mg total) by mouth 2 (two) times daily. Use as needed for GERD 60 capsule 5  . varenicline (CHANTIX CONTINUING MONTH PAK) 1 MG tablet Take 1 tablet (1 mg total) by mouth 2 (two) times daily. 60 tablet 3  . varenicline (CHANTIX STARTING MONTH PAK) 0.5 MG X 11 & 1 MG X 42 tablet Use as directed on pack 53 tablet 0   No current facility-administered medications on file prior to visit.     Review of Systems:  As per HPI- otherwise negative. No CP or SOB with exercise   Physical Examination: Vitals:   04/03/17 0827  BP: 132/82  Pulse: 87  Temp: 97.8 F (36.6 C)  SpO2: 98%   Vitals:   04/03/17  0827  Weight: 157 lb (71.2 kg)  Height: 5\' 2"  (1.575 m)   Body mass index is 28.72 kg/m. Ideal Body Weight: Weight in (lb) to have BMI = 25: 136.4  GEN: WDWN, NAD, Non-toxic, A & O x 3, overweight, looks well  HEENT: Atraumatic, Normocephalic. Neck supple. No masses, No LAD.  Bilateral TM wnl, oropharynx normal.  PEERL,EOMI.   Ears and Nose: No external deformity. CV: RRR, No M/G/R. No JVD. No thrill. No extra heart sounds. PULM: CTA B, no wheezes, crackles, rhonchi. No retractions. No resp. distress. No accessory muscle use. ABD: S, NT, ND, +BS. No rebound. No HSM. EXTR: No c/c/e NEURO Normal gait.  PSYCH: Normally interactive. Conversant. Not depressed or anxious appearing.  Calm demeanor.  Breast: normal exam, no masses/ dimpling/ discharge Pelvic: normal, no vaginal lesions or discharge. Uterus normal, no CMT, no adnexal tendereness or masses   Assessment and Plan: Physical exam  Screening for cervical cancer - Plan: Cytology - PAP  Screening for colon cancer  Essential hypertension - Plan: CBC, hydrochlorothiazide (HYDRODIURIL) 25 MG tablet  Tobacco abuse - Plan: Pneumococcal polysaccharide vaccine 23-valent greater than or equal to 2yo subcutaneous/IM  Vitamin D deficiency - Plan: Vitamin D (25 hydroxy)  Pre-diabetes - Plan: Comprehensive metabolic panel, Hemoglobin A1c  Screening for hyperlipidemia - Plan: Lipid panel  Osteopenia, unspecified location - Plan: DG Bone Density  Screening for osteoporosis - Plan: DG Bone Density  Immunization due - Plan: Pneumococcal polysaccharide vaccine 23-valent greater than or equal to 2yo subcutaneous/IM  CPE today Pap and labs pending Pneumovax today Ordered dexa for her Continue to encourage smoking cessation BP is under control, check K   Signed Abbe AmsterdamJessica Calob Baskette, MD  Received her labs, letter to pt  Your blood counts are normal Metabolic profile is ok A1c (average blood sugar) is in the pre-diabetes range, stable  from previous Cholesterol looks good Your vitamin D is low- I am going to rx a high dose vitamin D for you to take weekly for 12 weeks, then please change to a daily OTC vitamin D with 1,000 IU of vitamin D.  Please see me in about 6 months for a recheck.    Results for orders placed or performed in visit on 04/03/17  CBC  Result Value Ref Range   WBC 7.4 4.0 - 10.5 K/uL   RBC 3.92 3.87 - 5.11 Mil/uL   Platelets 345.0 150.0 - 400.0 K/uL   Hemoglobin 12.1 12.0 - 15.0 g/dL   HCT 16.135.5 (L) 09.636.0 - 04.546.0 %   MCV 90.5 78.0 - 100.0 fl  MCHC 34.2 30.0 - 36.0 g/dL   RDW 16.1 09.6 - 04.5 %  Comprehensive metabolic panel  Result Value Ref Range   Sodium 140 135 - 145 mEq/L   Potassium 3.4 (L) 3.5 - 5.1 mEq/L   Chloride 101 96 - 112 mEq/L   CO2 33 (H) 19 - 32 mEq/L   Glucose, Bld 105 (H) 70 - 99 mg/dL   BUN 14 6 - 23 mg/dL   Creatinine, Ser 4.09 0.40 - 1.20 mg/dL   Total Bilirubin 0.2 0.2 - 1.2 mg/dL   Alkaline Phosphatase 85 39 - 117 U/L   AST 13 0 - 37 U/L   ALT 10 0 - 35 U/L   Total Protein 7.0 6.0 - 8.3 g/dL   Albumin 3.8 3.5 - 5.2 g/dL   Calcium 9.5 8.4 - 81.1 mg/dL   GFR 91.47 >82.95 mL/min  Hemoglobin A1c  Result Value Ref Range   Hgb A1c MFr Bld 6.1 4.6 - 6.5 %  Lipid panel  Result Value Ref Range   Cholesterol 140 0 - 200 mg/dL   Triglycerides 621.3 (H) 0.0 - 149.0 mg/dL   HDL 08.65 >78.46 mg/dL   VLDL 96.2 0.0 - 95.2 mg/dL   LDL Cholesterol 61 0 - 99 mg/dL   Total CHOL/HDL Ratio 3    NonHDL 96.12   Vitamin D (25 hydroxy)  Result Value Ref Range   VITD 20.88 (L) 30.00 - 100.00 ng/mL   Also received pap- normal.  Letter to pt

## 2017-04-03 ENCOUNTER — Ambulatory Visit (INDEPENDENT_AMBULATORY_CARE_PROVIDER_SITE_OTHER): Payer: 59 | Admitting: Family Medicine

## 2017-04-03 ENCOUNTER — Encounter: Payer: Self-pay | Admitting: Family Medicine

## 2017-04-03 ENCOUNTER — Other Ambulatory Visit (HOSPITAL_COMMUNITY)
Admission: RE | Admit: 2017-04-03 | Discharge: 2017-04-03 | Disposition: A | Discharge status: Home / Self Care | Payer: 59 | Source: Ambulatory Visit | Attending: Family Medicine | Admitting: Family Medicine

## 2017-04-03 VITALS — BP 132/82 | HR 87 | Temp 97.8°F | Ht 62.0 in | Wt 157.0 lb

## 2017-04-03 DIAGNOSIS — Z23 Encounter for immunization: Secondary | ICD-10-CM

## 2017-04-03 DIAGNOSIS — Z13820 Encounter for screening for osteoporosis: Secondary | ICD-10-CM | POA: Diagnosis not present

## 2017-04-03 DIAGNOSIS — R7303 Prediabetes: Secondary | ICD-10-CM | POA: Diagnosis not present

## 2017-04-03 DIAGNOSIS — I10 Essential (primary) hypertension: Secondary | ICD-10-CM | POA: Diagnosis not present

## 2017-04-03 DIAGNOSIS — Z1211 Encounter for screening for malignant neoplasm of colon: Secondary | ICD-10-CM | POA: Diagnosis not present

## 2017-04-03 DIAGNOSIS — M8580 Other specified disorders of bone density and structure, unspecified site: Secondary | ICD-10-CM

## 2017-04-03 DIAGNOSIS — Z124 Encounter for screening for malignant neoplasm of cervix: Secondary | ICD-10-CM | POA: Insufficient documentation

## 2017-04-03 DIAGNOSIS — E559 Vitamin D deficiency, unspecified: Secondary | ICD-10-CM | POA: Diagnosis not present

## 2017-04-03 DIAGNOSIS — Z13220 Encounter for screening for lipoid disorders: Secondary | ICD-10-CM | POA: Diagnosis not present

## 2017-04-03 DIAGNOSIS — Z720 Tobacco use: Secondary | ICD-10-CM | POA: Diagnosis not present

## 2017-04-03 DIAGNOSIS — Z0000 Encounter for general adult medical examination without abnormal findings: Secondary | ICD-10-CM | POA: Diagnosis not present

## 2017-04-03 DIAGNOSIS — M810 Age-related osteoporosis without current pathological fracture: Secondary | ICD-10-CM | POA: Diagnosis not present

## 2017-04-03 LAB — LIPID PANEL
CHOL/HDL RATIO: 3
Cholesterol: 140 mg/dL (ref 0–200)
HDL: 43.9 mg/dL (ref >39.00)
LDL Cholesterol: 61 mg/dL (ref 0–99)
NONHDL: 96.12
Triglycerides: 176 mg/dL — ABNORMAL HIGH (ref 0.0–149.0)
VLDL: 35.2 mg/dL (ref 0.0–40.0)

## 2017-04-03 LAB — COMPREHENSIVE METABOLIC PANEL
ALK PHOS: 85 U/L (ref 39–117)
ALT: 10 U/L (ref 0–35)
AST: 13 U/L (ref 0–37)
Albumin: 3.8 g/dL (ref 3.5–5.2)
BILIRUBIN TOTAL: 0.2 mg/dL (ref 0.2–1.2)
BUN: 14 mg/dL (ref 6–23)
CO2: 33 mEq/L — ABNORMAL HIGH (ref 19–32)
Calcium: 9.5 mg/dL (ref 8.4–10.5)
Chloride: 101 mEq/L (ref 96–112)
Creatinine, Ser: 0.98 mg/dL (ref 0.40–1.20)
GFR: 73.42 mL/min (ref >60.00)
Glucose, Bld: 105 mg/dL — ABNORMAL HIGH (ref 70–99)
POTASSIUM: 3.4 meq/L — AB (ref 3.5–5.1)
Sodium: 140 mEq/L (ref 135–145)
TOTAL PROTEIN: 7 g/dL (ref 6.0–8.3)

## 2017-04-03 LAB — CBC
HEMATOCRIT: 35.5 % — AB (ref 36.0–46.0)
Hemoglobin: 12.1 g/dL (ref 12.0–15.0)
MCHC: 34.2 g/dL (ref 30.0–36.0)
MCV: 90.5 fl (ref 78.0–100.0)
PLATELETS: 345 10*3/uL (ref 150.0–400.0)
RBC: 3.92 Mil/uL (ref 3.87–5.11)
RDW: 13.4 % (ref 11.5–15.5)
WBC: 7.4 10*3/uL (ref 4.0–10.5)

## 2017-04-03 LAB — VITAMIN D 25 HYDROXY (VIT D DEFICIENCY, FRACTURES): VITD: 20.88 ng/mL — AB (ref 30.00–100.00)

## 2017-04-03 LAB — HEMOGLOBIN A1C: Hgb A1c MFr Bld: 6.1 % (ref 4.6–6.5)

## 2017-04-03 MED ORDER — VITAMIN D (ERGOCALCIFEROL) 1.25 MG (50000 UNIT) PO CAPS: 50000.0000 [IU] | ORAL_CAPSULE | ORAL | 0 refills | Status: DC

## 2017-04-03 MED ORDER — HYDROCHLOROTHIAZIDE 25 MG PO TABS: 25.0000 mg | ORAL_TABLET | Freq: Every day | ORAL | 3 refills | Status: DC

## 2017-04-03 NOTE — Patient Instructions (Signed)
Great to see you today- I'll be in touch with your labs asap We will set you up for a bone density scan   Health Maintenance for Postmenopausal Women Menopause is a normal process in which your reproductive ability comes to an end. This process happens gradually over a span of months to years, usually between the ages of 63 and 63. Menopause is complete when you have missed 12 consecutive menstrual periods. It is important to talk with your health care provider about some of the most common conditions that affect postmenopausal women, such as heart disease, cancer, and bone loss (osteoporosis). Adopting a healthy lifestyle and getting preventive care can help to promote your health and wellness. Those actions can also lower your chances of developing some of these common conditions. What should I know about menopause? During menopause, you may experience a number of symptoms, such as:  Moderate-to-severe hot flashes.  Night sweats.  Decrease in sex drive.  Mood swings.  Headaches.  Tiredness.  Irritability.  Memory problems.  Insomnia.  Choosing to treat or not to treat menopausal changes is an individual decision that you make with your health care provider. What should I know about hormone replacement therapy and supplements? Hormone therapy products are effective for treating symptoms that are associated with menopause, such as hot flashes and night sweats. Hormone replacement carries certain risks, especially as you become older. If you are thinking about using estrogen or estrogen with progestin treatments, discuss the benefits and risks with your health care provider. What should I know about heart disease and stroke? Heart disease, heart attack, and stroke become more likely as you age. This may be due, in part, to the hormonal changes that your body experiences during menopause. These can affect how your body processes dietary fats, triglycerides, and cholesterol. Heart attack  and stroke are both medical emergencies. There are many things that you can do to help prevent heart disease and stroke:  Have your blood pressure checked at least every 1-2 years. High blood pressure causes heart disease and increases the risk of stroke.  If you are 38-30 years old, ask your health care provider if you should take aspirin to prevent a heart attack or a stroke.  Do not use any tobacco products, including cigarettes, chewing tobacco, or electronic cigarettes. If you need help quitting, ask your health care provider.  It is important to eat a healthy diet and maintain a healthy weight. ? Be sure to include plenty of vegetables, fruits, low-fat dairy products, and lean protein. ? Avoid eating foods that are high in solid fats, added sugars, or salt (sodium).  Get regular exercise. This is one of the most important things that you can do for your health. ? Try to exercise for at least 150 minutes each week. The type of exercise that you do should increase your heart rate and make you sweat. This is known as moderate-intensity exercise. ? Try to do strengthening exercises at least twice each week. Do these in addition to the moderate-intensity exercise.  Know your numbers.Ask your health care provider to check your cholesterol and your blood glucose. Continue to have your blood tested as directed by your health care provider.  What should I know about cancer screening? There are several types of cancer. Take the following steps to reduce your risk and to catch any cancer development as early as possible. Breast Cancer  Practice breast self-awareness. ? This means understanding how your breasts normally appear and feel. ?  It also means doing regular breast self-exams. Let your health care provider know about any changes, no matter how small.  If you are 2 or older, have a clinician do a breast exam (clinical breast exam or CBE) every year. Depending on your age, family  history, and medical history, it may be recommended that you also have a yearly breast X-ray (mammogram).  If you have a family history of breast cancer, talk with your health care provider about genetic screening.  If you are at high risk for breast cancer, talk with your health care provider about having an MRI and a mammogram every year.  Breast cancer (BRCA) gene test is recommended for women who have family members with BRCA-related cancers. Results of the assessment will determine the need for genetic counseling and BRCA1 and for BRCA2 testing. BRCA-related cancers include these types: ? Breast. This occurs in males or females. ? Ovarian. ? Tubal. This may also be called fallopian tube cancer. ? Cancer of the abdominal or pelvic lining (peritoneal cancer). ? Prostate. ? Pancreatic.  Cervical, Uterine, and Ovarian Cancer Your health care provider may recommend that you be screened regularly for cancer of the pelvic organs. These include your ovaries, uterus, and vagina. This screening involves a pelvic exam, which includes checking for microscopic changes to the surface of your cervix (Pap test).  For women ages 21-65, health care providers may recommend a pelvic exam and a Pap test every three years. For women ages 56-65, they may recommend the Pap test and pelvic exam, combined with testing for human papilloma virus (HPV), every five years. Some types of HPV increase your risk of cervical cancer. Testing for HPV may also be done on women of any age who have unclear Pap test results.  Other health care providers may not recommend any screening for nonpregnant women who are considered low risk for pelvic cancer and have no symptoms. Ask your health care provider if a screening pelvic exam is right for you.  If you have had past treatment for cervical cancer or a condition that could lead to cancer, you need Pap tests and screening for cancer for at least 20 years after your treatment. If  Pap tests have been discontinued for you, your risk factors (such as having a new sexual partner) need to be reassessed to determine if you should start having screenings again. Some women have medical problems that increase the chance of getting cervical cancer. In these cases, your health care provider may recommend that you have screening and Pap tests more often.  If you have a family history of uterine cancer or ovarian cancer, talk with your health care provider about genetic screening.  If you have vaginal bleeding after reaching menopause, tell your health care provider.  There are currently no reliable tests available to screen for ovarian cancer.  Lung Cancer Lung cancer screening is recommended for adults 33-15 years old who are at high risk for lung cancer because of a history of smoking. A yearly low-dose CT scan of the lungs is recommended if you:  Currently smoke.  Have a history of at least 30 pack-years of smoking and you currently smoke or have quit within the past 15 years. A pack-year is smoking an average of one pack of cigarettes per day for one year.  Yearly screening should:  Continue until it has been 15 years since you quit.  Stop if you develop a health problem that would prevent you from having lung cancer  treatment.  Colorectal Cancer  This type of cancer can be detected and can often be prevented.  Routine colorectal cancer screening usually begins at age 31 and continues through age 82.  If you have risk factors for colon cancer, your health care provider may recommend that you be screened at an earlier age.  If you have a family history of colorectal cancer, talk with your health care provider about genetic screening.  Your health care provider may also recommend using home test kits to check for hidden blood in your stool.  A small camera at the end of a tube can be used to examine your colon directly (sigmoidoscopy or colonoscopy). This is done to  check for the earliest forms of colorectal cancer.  Direct examination of the colon should be repeated every 5-10 years until age 68. However, if early forms of precancerous polyps or small growths are found or if you have a family history or genetic risk for colorectal cancer, you may need to be screened more often.  Skin Cancer  Check your skin from head to toe regularly.  Monitor any moles. Be sure to tell your health care provider: ? About any new moles or changes in moles, especially if there is a change in a mole's shape or color. ? If you have a mole that is larger than the size of a pencil eraser.  If any of your family members has a history of skin cancer, especially at a young age, talk with your health care provider about genetic screening.  Always use sunscreen. Apply sunscreen liberally and repeatedly throughout the day.  Whenever you are outside, protect yourself by wearing long sleeves, pants, a wide-brimmed hat, and sunglasses.  What should I know about osteoporosis? Osteoporosis is a condition in which bone destruction happens more quickly than new bone creation. After menopause, you may be at an increased risk for osteoporosis. To help prevent osteoporosis or the bone fractures that can happen because of osteoporosis, the following is recommended:  If you are 8-17 years old, get at least 1,000 mg of calcium and at least 600 mg of vitamin D per day.  If you are older than age 56 but younger than age 36, get at least 1,200 mg of calcium and at least 600 mg of vitamin D per day.  If you are older than age 77, get at least 1,200 mg of calcium and at least 800 mg of vitamin D per day.  Smoking and excessive alcohol intake increase the risk of osteoporosis. Eat foods that are rich in calcium and vitamin D, and do weight-bearing exercises several times each week as directed by your health care provider. What should I know about how menopause affects my mental  health? Depression may occur at any age, but it is more common as you become older. Common symptoms of depression include:  Low or sad mood.  Changes in sleep patterns.  Changes in appetite or eating patterns.  Feeling an overall lack of motivation or enjoyment of activities that you previously enjoyed.  Frequent crying spells.  Talk with your health care provider if you think that you are experiencing depression. What should I know about immunizations? It is important that you get and maintain your immunizations. These include:  Tetanus, diphtheria, and pertussis (Tdap) booster vaccine.  Influenza every year before the flu season begins.  Pneumonia vaccine.  Shingles vaccine.  Your health care provider may also recommend other immunizations. This information is not intended to replace  advice given to you by your health care provider. Make sure you discuss any questions you have with your health care provider. Document Released: 03/23/2005 Document Revised: 08/19/2015 Document Reviewed: 11/02/2014 Elsevier Interactive Patient Education  2018 [employer].

## 2017-04-03 NOTE — Addendum Note (Signed)
Addended by: Abbe AmsterdamOPLAND, JESSICA C on: 04/03/2017 08:20 PM   Modules accepted: Orders

## 2017-04-05 LAB — CYTOLOGY - PAP
Diagnosis: NEGATIVE
HPV (WINDOPATH): NOT DETECTED

## 2017-04-10 ENCOUNTER — Other Ambulatory Visit: Payer: Self-pay | Admitting: Family Medicine

## 2017-04-10 DIAGNOSIS — K219 Gastro-esophageal reflux disease without esophagitis: Secondary | ICD-10-CM

## 2017-04-11 ENCOUNTER — Other Ambulatory Visit: Payer: Self-pay

## 2017-04-11 MED ORDER — ALENDRONATE SODIUM 70 MG PO TABS: ORAL_TABLET | ORAL | 3 refills | Status: DC

## 2017-04-12 ENCOUNTER — Inpatient Hospital Stay (HOSPITAL_BASED_OUTPATIENT_CLINIC_OR_DEPARTMENT_OTHER): Admission: RE | Admit: 2017-04-12 | Payer: 59 | Source: Ambulatory Visit

## 2017-04-15 ENCOUNTER — Ambulatory Visit (HOSPITAL_BASED_OUTPATIENT_CLINIC_OR_DEPARTMENT_OTHER)
Admission: RE | Admit: 2017-04-15 | Discharge: 2017-04-15 | Disposition: A | Discharge status: Home / Self Care | Payer: 59 | Source: Ambulatory Visit | Attending: Family Medicine | Admitting: Family Medicine

## 2017-04-15 ENCOUNTER — Encounter: Payer: Self-pay | Admitting: Family Medicine

## 2017-04-15 DIAGNOSIS — M85851 Other specified disorders of bone density and structure, right thigh: Secondary | ICD-10-CM | POA: Insufficient documentation

## 2017-04-15 DIAGNOSIS — M810 Age-related osteoporosis without current pathological fracture: Secondary | ICD-10-CM

## 2017-04-15 DIAGNOSIS — M8580 Other specified disorders of bone density and structure, unspecified site: Secondary | ICD-10-CM | POA: Diagnosis present

## 2017-04-16 ENCOUNTER — Telehealth: Payer: Self-pay

## 2017-04-16 NOTE — Telephone Encounter (Signed)
Received refill request from [facility]- Pt requesting refill on Chantix system- requesting 90 days supply. Please advise?

## 2017-04-17 MED ORDER — VARENICLINE TARTRATE 0.5 MG X 11 & 1 MG X 42 PO MISC: ORAL | 0 refills | Status: DC

## 2017-04-17 NOTE — Telephone Encounter (Signed)
Called pt- she does want to use chantix. Will rx starter pack for her- not practical to use 90 day rx with starter pack

## 2017-05-03 ENCOUNTER — Other Ambulatory Visit: Payer: Self-pay | Admitting: Emergency Medicine

## 2017-05-03 DIAGNOSIS — K219 Gastro-esophageal reflux disease without esophagitis: Secondary | ICD-10-CM

## 2017-05-03 MED ORDER — RANITIDINE HCL 150 MG PO TABS: 150.0000 mg | ORAL_TABLET | Freq: Two times a day (BID) | ORAL | 1 refills | Status: DC

## 2017-05-03 NOTE — Telephone Encounter (Signed)
Received 90 day refill request for ranitidine 150 mg tablet. Last office visit 04/03/17 and last refill 04/10/17. Refill sent to [facility] pharmacy as requested.

## 2017-05-28 ENCOUNTER — Emergency Department (HOSPITAL_BASED_OUTPATIENT_CLINIC_OR_DEPARTMENT_OTHER)
Admission: EM | Admit: 2017-05-28 | Discharge: 2017-05-28 | Disposition: A | Discharge status: Home / Self Care | Payer: 59 | Attending: Emergency Medicine | Admitting: Emergency Medicine

## 2017-05-28 ENCOUNTER — Encounter (HOSPITAL_BASED_OUTPATIENT_CLINIC_OR_DEPARTMENT_OTHER): Payer: Self-pay | Admitting: Emergency Medicine

## 2017-05-28 ENCOUNTER — Other Ambulatory Visit: Payer: Self-pay

## 2017-05-28 DIAGNOSIS — Z79899 Other long term (current) drug therapy: Secondary | ICD-10-CM | POA: Insufficient documentation

## 2017-05-28 DIAGNOSIS — S6991XA Unspecified injury of right wrist, hand and finger(s), initial encounter: Secondary | ICD-10-CM | POA: Diagnosis present

## 2017-05-28 DIAGNOSIS — Y93G1 Activity, food preparation and clean up: Secondary | ICD-10-CM | POA: Diagnosis not present

## 2017-05-28 DIAGNOSIS — I10 Essential (primary) hypertension: Secondary | ICD-10-CM | POA: Insufficient documentation

## 2017-05-28 DIAGNOSIS — W268XXA Contact with other sharp object(s), not elsewhere classified, initial encounter: Secondary | ICD-10-CM | POA: Diagnosis not present

## 2017-05-28 DIAGNOSIS — Y999 Unspecified external cause status: Secondary | ICD-10-CM | POA: Diagnosis not present

## 2017-05-28 DIAGNOSIS — F17210 Nicotine dependence, cigarettes, uncomplicated: Secondary | ICD-10-CM | POA: Diagnosis not present

## 2017-05-28 DIAGNOSIS — Y929 Unspecified place or not applicable: Secondary | ICD-10-CM | POA: Insufficient documentation

## 2017-05-28 DIAGNOSIS — S61314A Laceration without foreign body of right ring finger with damage to nail, initial encounter: Secondary | ICD-10-CM | POA: Insufficient documentation

## 2017-05-28 MED ORDER — CEPHALEXIN 500 MG PO CAPS: 500.0000 mg | ORAL_CAPSULE | Freq: Three times a day (TID) | ORAL | 0 refills | Status: DC

## 2017-05-28 MED ORDER — CEPHALEXIN 250 MG PO CAPS
500.0000 mg | ORAL_CAPSULE | Freq: Once | ORAL | Status: AC
  Administered 2017-05-28: 500 mg via ORAL
  Filled 2017-05-28: qty 2

## 2017-05-28 MED FILL — CEPHALEXIN 500 MG CAPSULE: 500 | 7 days supply | Qty: 21 | Fill #0

## 2017-05-28 NOTE — ED Triage Notes (Signed)
Pt cut RT ring finger on mini food processor blade on Saturday

## 2017-05-28 NOTE — Discharge Instructions (Addendum)
Wash wounds daily with soap and water.  Cover with Band-Aids and antibiotic ointment. Keflex as prescribed to prevent infection. May take as long as 4-6 weeks to completely heal. Check with your primary care physician with failure to improve, or worsening symptoms suggesting infection such as redness, drainage, red streaks, hand pain

## 2017-05-31 NOTE — ED Provider Notes (Signed)
[facility] EMERGENCY DEPARTMENT Provider Note   CSN: [id number] Arrival date & time: 05/28/17  [id number]     History   Chief Complaint Chief Complaint  Patient presents with  . Finger Injury    HPI Stephanie Phillips is a 67 y.o. female.  Chief complaint is finger injury  HPI: Patient presents here almost 4 days after cutting her finger on the sharp blade of a food processor.  It was not running at the time.  Has laceration to her finger.  No bleeding.  No redness or swelling.  Has nail involvement.  Past Medical History:  Diagnosis Date  . Arthritis   . Gastroesophageal reflux disease 03/15/2016  . Hypertension   . Osteoporosis 06/16/2015    Patient Active Problem List   Diagnosis Date Noted  . Pre-diabetes 03/15/2016  . Vitamin D deficiency 03/15/2016  . Gastroesophageal reflux disease 03/15/2016  . Non-intractable vomiting with nausea 03/15/2016  . Osteoporosis 06/16/2015  . Jones fracture 06/24/2013  . Benign hypertension 04/30/2013  . Decreased potassium in the blood 04/30/2013  . Current tobacco use 11/04/2012    Past Surgical History:  Procedure Laterality Date  . TONSILLECTOMY       OB History   None      Home Medications    Prior to Admission medications   Medication Sig Start Date End Date Taking? Authorizing Provider  alendronate (FOSAMAX) 70 MG tablet TAKE 1 TAB WEEKLY IN AM ON EMPTY STOMACH WITH FULL GLASS OF WATER.DONT LIE DOWN OR EAT FOR 45 MIN. 04/11/17   Copland, Gwenlyn FoundJessica C, MD  cephALEXin (KEFLEX) 500 MG capsule Take 1 capsule (500 mg total) by mouth 3 (three) times daily. 05/28/17   Rolland PorterJames, Winna Golla, MD  cetirizine (ZYRTEC) 10 MG tablet Take 1 tablet (10 mg total) by mouth at bedtime as needed (for itching). 02/13/16   Molpus, John, MD  hydrochlorothiazide (HYDRODIURIL) 25 MG tablet Take 1 tablet (25 mg total) by mouth daily. 04/03/17   Copland, Gwenlyn FoundJessica C, MD  potassium chloride (KLOR-CON 10) 10 MEQ tablet Take 1 tablet (10 mEq total) by mouth daily.  11/29/16   Copland, Gwenlyn FoundJessica C, MD  ranitidine (ZANTAC) 150 MG tablet Take 1 tablet (150 mg total) by mouth 2 (two) times daily. 05/03/17   Copland, Gwenlyn FoundJessica C, MD  varenicline (CHANTIX STARTING MONTH PAK) 0.5 MG X 11 & 1 MG X 42 tablet Use per package directions. When nearly out please contact me for refill 04/17/17   Copland, Gwenlyn FoundJessica C, MD  Vitamin D, Ergocalciferol, (DRISDOL) 50000 units CAPS capsule Take 1 capsule (50,000 Units total) by mouth every 7 (seven) days. 04/03/17   Copland, Gwenlyn FoundJessica C, MD    Family History Family History  Problem Relation Age of Onset  . Hypertension Mother   . Hypertension Father     Social History Social History   Tobacco Use  . Smoking status: Current Every Day Smoker    Packs/day: 1.00    Types: Cigarettes  . Smokeless tobacco: Never Used  Substance Use Topics  . Alcohol use: Yes    Alcohol/week: 0.0 oz  . Drug use: No     Allergies   Patient has no known allergies.   Review of Systems Review of Systems  Constitutional: Negative for appetite change, chills, diaphoresis, fatigue and fever.  HENT: Negative for mouth sores, sore throat and trouble swallowing.   Eyes: Negative for visual disturbance.  Respiratory: Negative for cough, chest tightness, shortness of breath and wheezing.   Cardiovascular: Negative for  chest pain.  Gastrointestinal: Negative for abdominal distention, abdominal pain, diarrhea, nausea and vomiting.  Endocrine: Negative for polydipsia, polyphagia and polyuria.  Genitourinary: Negative for dysuria, frequency and hematuria.  Musculoskeletal: Negative for gait problem.       Right ring finger injury  Skin: Negative for color change, pallor and rash.  Neurological: Negative for dizziness, syncope, light-headedness and headaches.  Hematological: Does not bruise/bleed easily.  Psychiatric/Behavioral: Negative for behavioral problems and confusion.     Physical Exam Updated Vital Signs BP 125/80 (BP Location: Right Leg)    Pulse 65   Temp (!) 97.4 F (36.3 C) (Oral)   Resp 14   Ht 5\' 2"  (1.575 m)   Wt 71.2 kg (157 lb)   SpO2 100%   BMI 28.72 kg/m   Physical Exam  Constitutional: She is oriented to person, place, and time. She appears well-developed and well-nourished. No distress.  HENT:  Head: Normocephalic.  Eyes: Pupils are equal, round, and reactive to light. Conjunctivae are normal. No scleral icterus.  Neck: Normal range of motion. Neck supple. No thyromegaly present.  Cardiovascular: Normal rate and regular rhythm. Exam reveals no gallop and no friction rub.  No murmur heard. Pulmonary/Chest: Effort normal and breath sounds normal. No respiratory distress. She has no wheezes. She has no rales.  Abdominal: Soft. Bowel sounds are normal. She exhibits no distension. There is no tenderness. There is no rebound.  Musculoskeletal: Normal range of motion.  Defect of proximal medial right ring finger nail distal phalanx soft tissues and only few millimeters of nail and nailbed.  Granulating well.  No sign of infection.  Neurological: She is alert and oriented to person, place, and time.  Skin: Skin is warm and dry. No rash noted.  Psychiatric: She has a normal mood and affect. Her behavior is normal.     ED Treatments / Results  Labs (all labs ordered are listed, but only abnormal results are displayed) Labs Reviewed - No data to display  EKG None  Radiology No results found.  Procedures Procedures (including critical care time)  Medications Ordered in ED Medications  cephALEXin (KEFLEX) capsule 500 mg (500 mg Oral Given 05/28/17 1034)     Initial Impression / Assessment and Plan / ED Course  I have reviewed the triage vital signs and the nursing notes.  Pertinent labs & imaging results that were available during my care of the patient were reviewed by me and considered in my medical decision making (see chart for details).    Not a candidate for primary or delayed primary closure.   Will put on antibiotic prophylaxis.  Wound was dressed clean.  We will recheck with her primary care.  Was told that this may take a month to granulate.  Final Clinical Impressions(s) / ED Diagnoses   Final diagnoses:  Laceration of right ring finger without foreign body with damage to nail, initial encounter    ED Discharge Orders        Ordered    cephALEXin (KEFLEX) 500 MG capsule  3 times daily     05/28/17 1020       Rolland Porter, MD 05/31/17 2033

## 2017-06-27 ENCOUNTER — Other Ambulatory Visit: Payer: Self-pay | Admitting: Family Medicine

## 2017-06-27 DIAGNOSIS — E559 Vitamin D deficiency, unspecified: Secondary | ICD-10-CM

## 2017-09-03 ENCOUNTER — Ambulatory Visit: Payer: 59 | Admitting: Family Medicine

## 2017-09-03 ENCOUNTER — Encounter: Payer: Self-pay | Admitting: Family Medicine

## 2017-09-03 ENCOUNTER — Ambulatory Visit (HOSPITAL_BASED_OUTPATIENT_CLINIC_OR_DEPARTMENT_OTHER)
Admission: RE | Admit: 2017-09-03 | Discharge: 2017-09-03 | Disposition: A | Discharge status: Home / Self Care | Payer: 59 | Source: Ambulatory Visit | Attending: Family Medicine | Admitting: Family Medicine

## 2017-09-03 VITALS — HR 91 | Ht 62.0 in | Wt 153.0 lb

## 2017-09-03 DIAGNOSIS — M7989 Other specified soft tissue disorders: Secondary | ICD-10-CM | POA: Insufficient documentation

## 2017-09-03 DIAGNOSIS — M79644 Pain in right finger(s): Secondary | ICD-10-CM | POA: Diagnosis present

## 2017-09-03 MED ORDER — NAPROXEN 500 MG PO TABS: 500.0000 mg | ORAL_TABLET | Freq: Two times a day (BID) | ORAL | 2 refills | Status: DC | PRN

## 2017-09-03 NOTE — Patient Instructions (Signed)
You sprained the MCP joint of your thumb and have a flexor tenosynovitis. Take naproxen 500mg  twice a day with food for pain and inflammation. Thumbkeeper brace as often as possible - ok to take off to ice, bathe, drive. Icing 15 minutes at a time 3-4 times a day. Follow up with me in 1 month for reevaluation.

## 2017-09-04 ENCOUNTER — Encounter: Payer: Self-pay | Admitting: Family Medicine

## 2017-09-04 DIAGNOSIS — M79644 Pain in right finger(s): Secondary | ICD-10-CM | POA: Insufficient documentation

## 2017-09-04 NOTE — Progress Notes (Signed)
PCP: Copland, Gwenlyn Found, MD  Subjective:   HPI: Patient is a 67 y.o. female here for right thumb pain.  Patient reports about 2-3 weeks ago she struck her right thumb against something. Felt pain, swelling about the MCP joint area since then. Difficulty bending her thumb. Pain level 3/10, can be sharp. Has tried ibuprofen 800 which helps some. No skin changes, numbness.  Past Medical History:  Diagnosis Date  . Arthritis   . Gastroesophageal reflux disease 03/15/2016  . Hypertension   . Osteoporosis 06/16/2015    Current Outpatient Medications on File Prior to Visit  Medication Sig Dispense Refill  . alendronate (FOSAMAX) 70 MG tablet TAKE 1 TAB WEEKLY IN AM ON EMPTY STOMACH WITH FULL GLASS OF WATER.DONT LIE DOWN OR EAT FOR 45 MIN. 12 tablet 3  . cetirizine (ZYRTEC) 10 MG tablet Take 1 tablet (10 mg total) by mouth at bedtime as needed (for itching).    . hydrochlorothiazide (HYDRODIURIL) 25 MG tablet Take 1 tablet (25 mg total) by mouth daily. 90 tablet 3  . potassium chloride (KLOR-CON 10) 10 MEQ tablet Take 1 tablet (10 mEq total) by mouth daily. 90 tablet 3  . ranitidine (ZANTAC) 150 MG tablet Take 1 tablet (150 mg total) by mouth 2 (two) times daily. 180 tablet 1  . varenicline (CHANTIX STARTING MONTH PAK) 0.5 MG X 11 & 1 MG X 42 tablet Use per package directions. When nearly out please contact me for refill 53 tablet 0  . Vitamin D, Ergocalciferol, (DRISDOL) 50000 units CAPS capsule TAKE 1 CAPSULE (50,000 UNITS TOTAL) BY MOUTH EVERY 7 (SEVEN) DAYS. 12 capsule 0   No current facility-administered medications on file prior to visit.     Past Surgical History:  Procedure Laterality Date  . TONSILLECTOMY      No Known Allergies  Social History   Socioeconomic History  . Marital status: Single    Spouse name: Not on file  . Number of children: Not on file  . Years of education: Not on file  . Highest education level: Not on file  Occupational History  . Not on file   Social Needs  . Financial resource strain: Not on file  . Food insecurity:    Worry: Not on file    Inability: Not on file  . Transportation needs:    Medical: Not on file    Non-medical: Not on file  Tobacco Use  . Smoking status: Current Every Day Smoker    Packs/day: 1.00    Types: Cigarettes  . Smokeless tobacco: Never Used  Substance and Sexual Activity  . Alcohol use: Yes    Alcohol/week: 0.0 oz  . Drug use: No  . Sexual activity: Not on file  Lifestyle  . Physical activity:    Days per week: Not on file    Minutes per session: Not on file  . Stress: Not on file  Relationships  . Social connections:    Talks on phone: Not on file    Gets together: Not on file    Attends religious service: Not on file    Active member of club or organization: Not on file    Attends meetings of clubs or organizations: Not on file    Relationship status: Not on file  . Intimate partner violence:    Fear of current or ex partner: Not on file    Emotionally abused: Not on file    Physically abused: Not on file    Forced sexual  activity: Not on file  Other Topics Concern  . Not on file  Social History Narrative  . Not on file    Family History  Problem Relation Age of Onset  . Hypertension Mother   . Hypertension Father     Pulse 91   Ht 5\' 2"  (1.575 m)   Wt 153 lb (69.4 kg)   BMI 27.98 kg/m   Review of Systems: See HPI above.     Objective:  Physical Exam:  Gen: NAD, comfortable in exam room  Right thumb/hand: Mild swelling circumferentially about 1st MCP.  No bruising, malrotation or angulation. Full extension at 1st MCP, flexion limited to 20 degrees due to pain.  FROM IP, CMC, wrist and other digits with 5/5 strength. 5/5 strength flexion and extension at 1st IP, MCP joints. Collateral ligaments intact of 1st MCP. TTP circumferentially about 1st MCP, greatest flexor aspect. No TTP 1st dorsal compartment, carpal tunnel. Negative finkelsteins, tinels. NVI  distally.  Left thumb/hand: No deformity. FROM with 5/5 strength. No tenderness to palpation. NVI distally.   Assessment & Plan:  1. Right thumb pain - independently reviewed radiographs and no evidence fracture.  Consistent with sprain but UCL, RCL are intact and MSK u/s demonstrated a flexor tenosynovitis as well.  Naproxen twice a day, thumbkeeper brace to rest, icing.  F/u in 1 month.

## 2017-09-04 NOTE — Assessment & Plan Note (Signed)
independently reviewed radiographs and no evidence fracture.  Consistent with sprain but UCL, RCL are intact and MSK u/s demonstrated a flexor tenosynovitis as well.  Naproxen twice a day, thumbkeeper brace to rest, icing.  F/u in 1 month.

## 2017-10-04 ENCOUNTER — Encounter: Payer: Self-pay | Admitting: Family Medicine

## 2017-10-04 ENCOUNTER — Ambulatory Visit: Payer: 59 | Admitting: Family Medicine

## 2017-10-04 VITALS — BP 117/84 | HR 71 | Ht 62.0 in | Wt 157.0 lb

## 2017-10-04 DIAGNOSIS — M79644 Pain in right finger(s): Secondary | ICD-10-CM | POA: Diagnosis not present

## 2017-10-04 NOTE — Patient Instructions (Signed)
Use the brace only as needed now. Icing 15 minutes at a time 3-4 times a day. Start motion exercises, strengthening with squeeze ball or play doh as we discussed. Call me if you're struggling and we can order occupational therapy for you. Aleve or ibuprofen only if needed. Follow up with me as needed otherwise.

## 2017-10-06 ENCOUNTER — Encounter: Payer: Self-pay | Admitting: Family Medicine

## 2017-10-06 NOTE — Progress Notes (Signed)
PCP: Copland, Gwenlyn Found, MD  Subjective:   HPI: Patient is a 67 y.o. female here for right thumb pain.  7/23: Patient reports about 2-3 weeks ago she struck her right thumb against something. Felt pain, swelling about the MCP joint area since then. Difficulty bending her thumb. Pain level 3/10, can be sharp. Has tried ibuprofen 800 which helps some. No skin changes, numbness.  8/23: Patient reports she feels about the same. Using brace, icing, and taking naproxen as needed. Pain currently 0/10. Continues to have swelling, some limitation of motion but improved from this standpoint. No numbness.  Past Medical History:  Diagnosis Date  . Arthritis   . Gastroesophageal reflux disease 03/15/2016  . Hypertension   . Osteoporosis 06/16/2015    Current Outpatient Medications on File Prior to Visit  Medication Sig Dispense Refill  . alendronate (FOSAMAX) 70 MG tablet TAKE 1 TAB WEEKLY IN AM ON EMPTY STOMACH WITH FULL GLASS OF WATER.DONT LIE DOWN OR EAT FOR 45 MIN. 12 tablet 3  . cetirizine (ZYRTEC) 10 MG tablet Take 1 tablet (10 mg total) by mouth at bedtime as needed (for itching).    . hydrochlorothiazide (HYDRODIURIL) 25 MG tablet Take 1 tablet (25 mg total) by mouth daily. 90 tablet 3  . naproxen (NAPROSYN) 500 MG tablet Take 1 tablet (500 mg total) by mouth 2 (two) times daily as needed. 60 tablet 2  . potassium chloride (KLOR-CON 10) 10 MEQ tablet Take 1 tablet (10 mEq total) by mouth daily. 90 tablet 3  . ranitidine (ZANTAC) 150 MG tablet Take 1 tablet (150 mg total) by mouth 2 (two) times daily. 180 tablet 1  . varenicline (CHANTIX STARTING MONTH PAK) 0.5 MG X 11 & 1 MG X 42 tablet Use per package directions. When nearly out please contact me for refill 53 tablet 0  . Vitamin D, Ergocalciferol, (DRISDOL) 50000 units CAPS capsule TAKE 1 CAPSULE (50,000 UNITS TOTAL) BY MOUTH EVERY 7 (SEVEN) DAYS. 12 capsule 0   No current facility-administered medications on file prior to visit.      Past Surgical History:  Procedure Laterality Date  . TONSILLECTOMY      No Known Allergies  Social History   Socioeconomic History  . Marital status: Single    Spouse name: Not on file  . Number of children: Not on file  . Years of education: Not on file  . Highest education level: Not on file  Occupational History  . Not on file  Social Needs  . Financial resource strain: Not on file  . Food insecurity:    Worry: Not on file    Inability: Not on file  . Transportation needs:    Medical: Not on file    Non-medical: Not on file  Tobacco Use  . Smoking status: Current Every Day Smoker    Packs/day: 1.00    Types: Cigarettes  . Smokeless tobacco: Never Used  Substance and Sexual Activity  . Alcohol use: Yes    Alcohol/week: 0.0 standard drinks  . Drug use: No  . Sexual activity: Not on file  Lifestyle  . Physical activity:    Days per week: Not on file    Minutes per session: Not on file  . Stress: Not on file  Relationships  . Social connections:    Talks on phone: Not on file    Gets together: Not on file    Attends religious service: Not on file    Active member of club or organization:  Not on file    Attends meetings of clubs or organizations: Not on file    Relationship status: Not on file  . Intimate partner violence:    Fear of current or ex partner: Not on file    Emotionally abused: Not on file    Physically abused: Not on file    Forced sexual activity: Not on file  Other Topics Concern  . Not on file  Social History Narrative  . Not on file    Family History  Problem Relation Age of Onset  . Hypertension Mother   . Hypertension Father     BP 117/84   Pulse 71   Ht 5\' 2"  (1.575 m)   Wt 157 lb (71.2 kg)   BMI 28.72 kg/m   Review of Systems: See HPI above.     Objective:  Physical Exam:  Gen: NAD, comfortable in exam room  Right thumb/hand: Mild swelling circumferentially 1st MCP.  No malrotation, angulation, other  deformity. FROM IP, CMC joints.  Mild limitation at MCP but much improved with 5/5 strength flexion and extension, thumb opposition. Collateral ligaments intact of 1st MCP. No TTP. NVI distally.   Assessment & Plan:  1. Right thumb pain - exam again reassuring.  Continues to have swelling but collaterals with solid end points, motion has improved.  Offered occupational therapy but she declined and instead will do home exercises.  Icing, aleve or ibuprofen if needed.  F/u prn.

## 2018-01-06 ENCOUNTER — Other Ambulatory Visit: Payer: Self-pay | Admitting: Family Medicine

## 2018-01-06 DIAGNOSIS — I10 Essential (primary) hypertension: Secondary | ICD-10-CM

## 2018-03-17 ENCOUNTER — Other Ambulatory Visit: Payer: Self-pay | Admitting: Family Medicine

## 2018-03-17 DIAGNOSIS — I10 Essential (primary) hypertension: Secondary | ICD-10-CM

## 2018-04-04 NOTE — Progress Notes (Addendum)
[facility] [address], [address] [place], [place] [address] [phone]  Date:  04/07/2018   Name:  Stephanie Phillips   DOB:  1950-04-07   MRN:  [id number]  PCP:  Pearline Cables, MD    Chief Complaint: Annual Exam (declines flu shot)   History of Present Illness:  Stephanie Phillips is a 68 y.o. very pleasant female patient who presents with the following:  Here today for a CPE History of HTN, osteoporosis, pre-diabetes  Last seen by myself about 1 year ago, also for physical  Pap: 1 year ago, negative, HPV negative Mammo-: done at [facility], in November per pt, it was normal DEXA: March 2019, osteopenia Labs: Due for complete, she is fasting today  Immunizations: Can give Prevnar today Colon cancer screening: per pt this was done per [place], she is not sure of the date.  I will request this report  Flu- pt declines  ?  Shingrix  She is a smoker- 40+Pack years, consider screening CT She is trying to quit.  She is using chantix but admits she has not stuck with using this She has been a smoker since 1975, 1 PPD.  She would like to do a screening CT  She works at the [employer], plans to work for about 1 more year- she just hit 40 years of service 2 weeks ago  She has ranitidine but is not taking it Advised that she does not need to use it   She is on hctz 25 She is not really taking her potassium "like I should be"   No bleeding  No CP or SOB   She walks her dog at least 1 hour daily   Patient Active Problem List   Diagnosis Date Noted  . Pain of right thumb 09/04/2017  . Pre-diabetes 03/15/2016  . Vitamin D deficiency 03/15/2016  . Gastroesophageal reflux disease 03/15/2016  . Osteoporosis 06/16/2015  . Benign hypertension 04/30/2013  . Decreased potassium in the blood 04/30/2013  . Current tobacco use 11/04/2012    Past Medical History:  Diagnosis Date  . Arthritis   . Gastroesophageal reflux disease  03/15/2016  . Hypertension   . Osteoporosis 06/16/2015    Past Surgical History:  Procedure Laterality Date  . TONSILLECTOMY      Social History   Tobacco Use  . Smoking status: Current Every Day Smoker    Packs/day: 1.00    Types: Cigarettes  . Smokeless tobacco: Never Used  Substance Use Topics  . Alcohol use: Yes    Alcohol/week: 0.0 standard drinks  . Drug use: No    Family History  Problem Relation Age of Onset  . Hypertension Mother   . Hypertension Father     No Known Allergies  Medication list has been reviewed and updated.  Current Outpatient Medications on File Prior to Visit  Medication Sig Dispense Refill  . alendronate (FOSAMAX) 70 MG tablet TAKE 1 TAB WEEKLY IN AM ON EMPTY STOMACH WITH FULL GLASS OF WATER.DONT LIE DOWN OR EAT FOR 45 MIN. 12 tablet 3  . cetirizine (ZYRTEC) 10 MG tablet Take 1 tablet (10 mg total) by mouth at bedtime as needed (for itching).    . hydrochlorothiazide (HYDRODIURIL) 25 MG tablet TAKE 1 TABLET BY MOUTH EVERY DAY 30 tablet 0  . KLOR-CON 10 10 MEQ tablet TAKE 1 TABLET BY MOUTH EVERY DAY 90 tablet 1  . naproxen (NAPROSYN) 500 MG tablet Take 1  tablet (500 mg total) by mouth 2 (two) times daily as needed. 60 tablet 2  . ranitidine (ZANTAC) 150 MG tablet Take 1 tablet (150 mg total) by mouth 2 (two) times daily. 180 tablet 1  . varenicline (CHANTIX STARTING MONTH PAK) 0.5 MG X 11 & 1 MG X 42 tablet Use per package directions. When nearly out please contact me for refill 53 tablet 0  . Vitamin D, Ergocalciferol, (DRISDOL) 50000 units CAPS capsule TAKE 1 CAPSULE (50,000 UNITS TOTAL) BY MOUTH EVERY 7 (SEVEN) DAYS. 12 capsule 0   No current facility-administered medications on file prior to visit.     Review of Systems:  As per HPI- otherwise negative. She did have a mouth bx per her DDS one week ago- she is still waiting   Physical Examination: Vitals:   04/07/18 0843  BP: 132/84  Pulse: 65  Resp: 16  Temp: 97.6 F (36.4 C)   SpO2: 97%   Vitals:   04/07/18 0843  Weight: 153 lb (69.4 kg)  Height: 5\' 2"  (1.575 m)   Body mass index is 27.98 kg/m. Ideal Body Weight: Weight in (lb) to have BMI = 25: 136.4  GEN: WDWN, NAD, Non-toxic, A & O x 3, mild overweight, looks well HEENT: Atraumatic, Normocephalic. Neck supple. No masses, No LAD.  Bilateral TM wnl, oropharynx normal.  PEERL,EOMI.   Ears and Nose: No external deformity. CV: RRR, No M/G/R. No JVD. No thrill. No extra heart sounds. PULM: CTA B, no wheezes, crackles, rhonchi. No retractions. No resp. distress. No accessory muscle use. ABD: S, NT, ND, +BS. No rebound. No HSM. EXTR: No c/c/e NEURO Normal gait.  PSYCH: Normally interactive. Conversant. Not depressed or anxious appearing.  Calm demeanor.    Assessment and Plan: Physical exam  Essential hypertension  Screening for hyperlipidemia - Plan: Lipid panel  Pre-diabetes - Plan: Comprehensive metabolic panel, Hemoglobin A1c  Screening for deficiency anemia - Plan: CBC  Tobacco abuse - Plan: CT CHEST LUNG CA SCREEN LOW DOSE W/O CM  Immunization due - Plan: Pneumococcal conjugate vaccine 13-valent IM  Complete physical exam today Blood pressure under control on 25 mg HCTZ.  However patient admits she has not been taking her potassium like she should.  The potassium is low, consider changing her to lisinopril and HCTZ combo  She will need a RF of her BP med regardless   Prevnar today Screening labs Request colonoscopy report CT lung cancer screening order  Signed Abbe Amsterdam, MD  Received her labs-call left message on machine She is minimally anemic Colonoscopy may be due, I requested report A1c is improved from previous I would suggest a cholesterol medication Her potassium is minimally low.  I will change her from HCTZ 25 to lisinopril 10/HCTZ 12-1/2.  Asked her to let me know if this change is not okay with her.  When she makes this change, I would stop the potassium pill The  10-year ASCVD risk score Denman George DC Montez Hageman., et al., 2013) is: 13.4%   Values used to calculate the score:     Age: 35 years     Sex: Female     Is Non-Hispanic African American: Yes     Diabetic: No     Tobacco smoker: Yes     Systolic Blood Pressure: 132 mmHg     Is BP treated: Yes     HDL Cholesterol: 49.5 mg/dL     Total Cholesterol: 133 mg/dL   Results for orders placed or performed in visit  on 04/07/18  CBC  Result Value Ref Range   WBC 6.1 4.0 - 10.5 K/uL   RBC 3.78 (L) 3.87 - 5.11 Mil/uL   Platelets 356.0 150.0 - 400.0 K/uL   Hemoglobin 11.8 (L) 12.0 - 15.0 g/dL   HCT 16.134.4 (L) 09.636.0 - 04.546.0 %   MCV 91.0 78.0 - 100.0 fl   MCHC 34.3 30.0 - 36.0 g/dL   RDW 40.913.4 81.111.5 - 91.415.5 %  Comprehensive metabolic panel  Result Value Ref Range   Sodium 141 135 - 145 mEq/L   Potassium 3.4 (L) 3.5 - 5.1 mEq/L   Chloride 102 96 - 112 mEq/L   CO2 32 19 - 32 mEq/L   Glucose, Bld 91 70 - 99 mg/dL   BUN 14 6 - 23 mg/dL   Creatinine, Ser 7.820.97 0.40 - 1.20 mg/dL   Total Bilirubin 0.3 0.2 - 1.2 mg/dL   Alkaline Phosphatase 80 39 - 117 U/L   AST 12 0 - 37 U/L   ALT 10 0 - 35 U/L   Total Protein 6.8 6.0 - 8.3 g/dL   Albumin 4.2 3.5 - 5.2 g/dL   Calcium 9.8 8.4 - 95.610.5 mg/dL   GFR 21.3069.68 >86.57>60.00 mL/min  Hemoglobin A1c  Result Value Ref Range   Hgb A1c MFr Bld 5.9 4.6 - 6.5 %  Lipid panel  Result Value Ref Range   Cholesterol 133 0 - 200 mg/dL   Triglycerides 84.680.0 0.0 - 149.0 mg/dL   HDL 96.2949.50 >52.84>39.00 mg/dL   VLDL 13.216.0 0.0 - 44.040.0 mg/dL   LDL Cholesterol 67 0 - 99 mg/dL   Total CHOL/HDL Ratio 3    NonHDL 83.43

## 2018-04-04 NOTE — Patient Instructions (Addendum)
It was great to see you again today, I will be in touch with your labs We will check on your potassium level.  If it is low, we might consider changing your blood pressure medication as you mentioned  You got your second pneumonia vaccine today.  You might also consider getting the shingles vaccine, known as Shingrix.  This is available at your drugstore  I will request your colonoscopy report. Please do continue to work on quitting smoking.  This will decrease your risk of lung cancer and also oral cancers We will set up a screening CT scan for you, let me know if you do not hear about this appointment Continue your good exercise habits   Health Maintenance After Age 40 After age 61, you are at a higher risk for certain long-term diseases and infections as well as injuries from falls. Falls are a major cause of broken bones and head injuries in people who are older than age 97. Getting regular preventive care can help to keep you healthy and well. Preventive care includes getting regular testing and making lifestyle changes as recommended by your health care provider. Talk with your health care provider about:  Which screenings and tests you should have. A screening is a test that checks for a disease when you have no symptoms.  A diet and exercise plan that is right for you. What should I know about screenings and tests to prevent falls? Screening and testing are the best ways to find a health problem early. Early diagnosis and treatment give you the best chance of managing medical conditions that are common after age 17. Certain conditions and lifestyle choices may make you more likely to have a fall. Your health care provider may recommend:  Regular vision checks. Poor vision and conditions such as cataracts can make you more likely to have a fall. If you wear glasses, make sure to get your prescription updated if your vision changes.  Medicine review. Work with your health care provider to  regularly review all of the medicines you are taking, including over-the-counter medicines. Ask your health care provider about any side effects that may make you more likely to have a fall. Tell your health care provider if any medicines that you take make you feel dizzy or sleepy.  Osteoporosis screening. Osteoporosis is a condition that causes the bones to get weaker. This can make the bones weak and cause them to break more easily.  Blood pressure screening. Blood pressure changes and medicines to control blood pressure can make you feel dizzy.  Strength and balance checks. Your health care provider may recommend certain tests to check your strength and balance while standing, walking, or changing positions.  Foot health exam. Foot pain and numbness, as well as not wearing proper footwear, can make you more likely to have a fall.  Depression screening. You may be more likely to have a fall if you have a fear of falling, feel emotionally low, or feel unable to do activities that you used to do.  Alcohol use screening. Using too much alcohol can affect your balance and may make you more likely to have a fall. What actions can I take to lower my risk of falls? General instructions  Talk with your health care provider about your risks for falling. Tell your health care provider if: ? You fall. Be sure to tell your health care provider about all falls, even ones that seem minor. ? You feel dizzy, sleepy, or off-balance.  Take over-the-counter and prescription medicines only as told by your health care provider. These include any supplements.  Eat a healthy diet and maintain a healthy weight. A healthy diet includes low-fat dairy products, low-fat (lean) meats, and fiber from whole grains, beans, and lots of fruits and vegetables. Home safety  Remove any tripping hazards, such as rugs, cords, and clutter.  Install safety equipment such as grab bars in bathrooms and safety rails on  stairs.  Keep rooms and walkways well-lit. Activity   Follow a regular exercise program to stay fit. This will help you maintain your balance. Ask your health care provider what types of exercise are appropriate for you.  If you need a cane or walker, use it as recommended by your health care provider.  Wear supportive shoes that have nonskid soles. Lifestyle  Do not drink alcohol if your health care provider tells you not to drink.  If you drink alcohol, limit how much you have: ? 0-1 drink a day for women. ? 0-2 drinks a day for men.  Be aware of how much alcohol is in your drink. In the [place] one drink equals one typical bottle of beer (12 oz), one-half glass of wine (5 oz), or one shot of hard liquor (1 oz).  Do not use any products that contain nicotine or tobacco, such as cigarettes and e-cigarettes. If you need help quitting, ask your health care provider. Summary  Having a healthy lifestyle and getting preventive care can help to protect your health and wellness after age 51.  Screening and testing are the best way to find a health problem early and help you avoid having a fall. Early diagnosis and treatment give you the best chance for managing medical conditions that are more common for people who are older than age 16.  Falls are a major cause of broken bones and head injuries in people who are older than age 42. Take precautions to prevent a fall at home.  Work with your health care provider to learn what changes you can make to improve your health and wellness and to prevent falls. This information is not intended to replace advice given to you by your health care provider. Make sure you discuss any questions you have with your health care provider. Document Released: 12/12/2016 Document Revised: 12/12/2016 Document Reviewed: 12/12/2016 Elsevier Interactive Patient Education  2019 [employer].

## 2018-04-07 ENCOUNTER — Ambulatory Visit (INDEPENDENT_AMBULATORY_CARE_PROVIDER_SITE_OTHER): Payer: 59 | Admitting: Family Medicine

## 2018-04-07 ENCOUNTER — Encounter: Payer: Self-pay | Admitting: Family Medicine

## 2018-04-07 VITALS — BP 132/84 | HR 65 | Temp 97.6°F | Resp 16 | Ht 62.0 in | Wt 153.0 lb

## 2018-04-07 DIAGNOSIS — Z13220 Encounter for screening for lipoid disorders: Secondary | ICD-10-CM | POA: Diagnosis not present

## 2018-04-07 DIAGNOSIS — Z720 Tobacco use: Secondary | ICD-10-CM

## 2018-04-07 DIAGNOSIS — Z130 Encounter for screening for diseases of the blood and blood-forming organs and certain disorders involving the immune mechanism: Secondary | ICD-10-CM | POA: Diagnosis not present

## 2018-04-07 DIAGNOSIS — Z23 Encounter for immunization: Secondary | ICD-10-CM

## 2018-04-07 DIAGNOSIS — R7303 Prediabetes: Secondary | ICD-10-CM | POA: Diagnosis not present

## 2018-04-07 DIAGNOSIS — I10 Essential (primary) hypertension: Secondary | ICD-10-CM

## 2018-04-07 DIAGNOSIS — Z0000 Encounter for general adult medical examination without abnormal findings: Secondary | ICD-10-CM | POA: Diagnosis not present

## 2018-04-07 LAB — COMPREHENSIVE METABOLIC PANEL
ALT: 10 U/L (ref 0–35)
AST: 12 U/L (ref 0–37)
Albumin: 4.2 g/dL (ref 3.5–5.2)
Alkaline Phosphatase: 80 U/L (ref 39–117)
BUN: 14 mg/dL (ref 6–23)
CALCIUM: 9.8 mg/dL (ref 8.4–10.5)
CO2: 32 mEq/L (ref 19–32)
Chloride: 102 mEq/L (ref 96–112)
Creatinine, Ser: 0.97 mg/dL (ref 0.40–1.20)
GFR: 69.68 mL/min (ref >60.00)
GLUCOSE: 91 mg/dL (ref 70–99)
Potassium: 3.4 mEq/L — ABNORMAL LOW (ref 3.5–5.1)
Sodium: 141 mEq/L (ref 135–145)
Total Bilirubin: 0.3 mg/dL (ref 0.2–1.2)
Total Protein: 6.8 g/dL (ref 6.0–8.3)

## 2018-04-07 LAB — CBC
HCT: 34.4 % — ABNORMAL LOW (ref 36.0–46.0)
Hemoglobin: 11.8 g/dL — ABNORMAL LOW (ref 12.0–15.0)
MCHC: 34.3 g/dL (ref 30.0–36.0)
MCV: 91 fl (ref 78.0–100.0)
PLATELETS: 356 10*3/uL (ref 150.0–400.0)
RBC: 3.78 Mil/uL — ABNORMAL LOW (ref 3.87–5.11)
RDW: 13.4 % (ref 11.5–15.5)
WBC: 6.1 10*3/uL (ref 4.0–10.5)

## 2018-04-07 LAB — LIPID PANEL
Cholesterol: 133 mg/dL (ref 0–200)
HDL: 49.5 mg/dL (ref >39.00)
LDL Cholesterol: 67 mg/dL (ref 0–99)
NonHDL: 83.43
Total CHOL/HDL Ratio: 3
Triglycerides: 80 mg/dL (ref 0.0–149.0)
VLDL: 16 mg/dL (ref 0.0–40.0)

## 2018-04-07 LAB — HEMOGLOBIN A1C: Hgb A1c MFr Bld: 5.9 % (ref 4.6–6.5)

## 2018-04-07 MED ORDER — LISINOPRIL-HYDROCHLOROTHIAZIDE 10-12.5 MG PO TABS: 1.0000 | ORAL_TABLET | Freq: Every day | ORAL | 3 refills | Status: DC

## 2018-04-07 NOTE — Addendum Note (Signed)
Addended by: Abbe Amsterdam C on: 04/07/2018 05:16 PM   Modules accepted: Orders

## 2018-04-09 ENCOUNTER — Ambulatory Visit (HOSPITAL_BASED_OUTPATIENT_CLINIC_OR_DEPARTMENT_OTHER): Payer: 59

## 2018-04-10 ENCOUNTER — Encounter: Payer: Self-pay | Admitting: Family Medicine

## 2018-04-10 ENCOUNTER — Encounter (HOSPITAL_BASED_OUTPATIENT_CLINIC_OR_DEPARTMENT_OTHER)
Admission: RE | Admit: 2018-04-10 | Discharge: 2018-04-10 | Disposition: A | Discharge status: Home / Self Care | Payer: 59 | Source: Ambulatory Visit | Attending: Family Medicine | Admitting: Family Medicine

## 2018-04-10 DIAGNOSIS — Z720 Tobacco use: Secondary | ICD-10-CM

## 2018-04-10 DIAGNOSIS — Z122 Encounter for screening for malignant neoplasm of respiratory organs: Secondary | ICD-10-CM | POA: Insufficient documentation

## 2018-05-01 ENCOUNTER — Encounter: Payer: Self-pay | Admitting: Family Medicine

## 2018-05-01 ENCOUNTER — Telehealth: Payer: Self-pay

## 2018-05-01 DIAGNOSIS — E785 Hyperlipidemia, unspecified: Secondary | ICD-10-CM

## 2018-05-01 MED ORDER — ATORVASTATIN CALCIUM 10 MG PO TABS: 10.0000 mg | ORAL_TABLET | Freq: Every day | ORAL | 3 refills | Status: DC

## 2018-05-01 NOTE — Telephone Encounter (Signed)
Copied from CRM [id number]. Topic: General - Other >> May 01, 2018 11:52 AM Floria Raveling A wrote: Reason for CRM: pt called in and stated that Dr Patsy Lager told her to let her know if she wanted to try some Cholesterol meds.  Pt stated that this was mentioned it in the lab letter that was sent to her.  She stated she would like to try it   Pharmacy - [facility] in [place]

## 2018-06-17 ENCOUNTER — Telehealth: Payer: Self-pay | Admitting: Family Medicine

## 2018-06-17 MED ORDER — POTASSIUM CHLORIDE IN NACL 20-0.9 MEQ/L-% IV SOLN
INTRAVENOUS | Status: DC
Start: ? — End: 2018-06-17

## 2018-06-17 MED ORDER — ATORVASTATIN CALCIUM 10 MG PO TABS
10.00 | ORAL_TABLET | ORAL | Status: DC
Start: 2018-06-18 — End: 2018-06-17

## 2018-06-17 MED ORDER — VITAMIN D (ERGOCALCIFEROL) 1.25 MG (50000 UT) PO CAPS
50000.00 | ORAL_CAPSULE | ORAL | Status: DC
Start: ? — End: 2018-06-17

## 2018-06-17 MED ORDER — HYDROCODONE-ACETAMINOPHEN 5-325 MG PO TABS
1.00 | ORAL_TABLET | ORAL | Status: DC
Start: ? — End: 2018-06-17

## 2018-06-17 MED ORDER — FAMOTIDINE 20 MG PO TABS
20.00 | ORAL_TABLET | ORAL | Status: DC
Start: 2018-06-17 — End: 2018-06-17

## 2018-06-17 MED ORDER — LISINOPRIL 5 MG PO TABS
10.00 | ORAL_TABLET | ORAL | Status: DC
Start: 2018-06-18 — End: 2018-06-17

## 2018-06-17 MED ORDER — ACETAMINOPHEN 325 MG PO TABS
650.00 | ORAL_TABLET | ORAL | Status: DC
Start: ? — End: 2018-06-17

## 2018-06-17 MED ORDER — NICOTINE 21 MG/24HR TD PT24
1.00 | MEDICATED_PATCH | TRANSDERMAL | Status: DC
Start: 2018-06-18 — End: 2018-06-17

## 2018-06-17 MED ORDER — BISACODYL 5 MG PO TBEC
10.00 | DELAYED_RELEASE_TABLET | ORAL | Status: DC
Start: ? — End: 2018-06-17

## 2018-06-17 MED ORDER — ONDANSETRON HCL 4 MG/2ML IJ SOLN
4.00 | INTRAMUSCULAR | Status: DC
Start: ? — End: 2018-06-17

## 2018-06-17 MED ORDER — DOCUSATE SODIUM 100 MG PO CAPS
100.00 | ORAL_CAPSULE | ORAL | Status: DC
Start: ? — End: 2018-06-17

## 2018-06-17 MED ORDER — AMOXICILLIN-POT CLAVULANATE 875-125 MG PO TABS
875.00 | ORAL_TABLET | ORAL | Status: DC
Start: 2018-06-17 — End: 2018-06-17

## 2018-06-17 MED ORDER — ENOXAPARIN SODIUM 40 MG/0.4ML ~~LOC~~ SOLN
40.00 | SUBCUTANEOUS | Status: DC
Start: 2018-06-18 — End: 2018-06-17

## 2018-06-17 MED ORDER — GUAIFENESIN-DM 100-10 MG/5ML PO SYRP
5.00 | ORAL_SOLUTION | ORAL | Status: DC
Start: ? — End: 2018-06-17

## 2018-06-17 NOTE — Telephone Encounter (Signed)
Called and [facility]- thank you for the update.  I will see her when she is released from the hospital.  It sounds like the inpt team is taking good care of her

## 2018-06-17 NOTE — Telephone Encounter (Signed)
Copied from CRM [id number]. Topic: Quick Communication - See Telephone Encounter >> Jun 17, 2018 10:58 AM Louie Bun, Rosey Bath D wrote: CRM for notification. See Telephone encounter for: 06/17/18. Patient called and wanted Dr. Patsy Lager know that she has been admitted to the hospital because her potasium was at 2% and because she got light headed. They said that they will change her current BP medications. Patient would like to talk to provider, please return patients call back, thanks.

## 2018-06-17 NOTE — Telephone Encounter (Signed)
Pt admitted at [facility].  

## 2018-06-24 DIAGNOSIS — E785 Hyperlipidemia, unspecified: Secondary | ICD-10-CM | POA: Insufficient documentation

## 2018-06-24 NOTE — Progress Notes (Signed)
Healthcare at [facility] [address] [place], [place] [address] [phone]  Date:  06/25/2018   Name:  Stephanie Phillips   DOB:  February 19, 1950   MRN:  [id number]  PCP:  Pearline Cables, MD    Chief Complaint: No chief complaint on file.   History of Present Illness:  Stephanie Phillips is a 68 y.o. very pleasant female patient who presents with the following:  Virtual hospital follow-up visit today due to pandemic Patient location is home Provider location is office Patient identity confirmed with name and date of birth, she gives consent for visit today However during visit noted that Stephanie Phillips has stitches in her left brow, which need to be removed.  We will have her come into the office  History of hypertension, osteoporosis, smoking, prediabetes, vitamin D deficiency, hyperlipidemia Daniyla was recently admitted at [facility] overnight, from May 4 to May 5 See summary below.  In essence, she tripped while out walking and broke her wrist.  She then returned home and had a syncopal episode, during which she fell and sustained a facial fracture  Principal Problem: Syncope and collapse Active Problems: Hypertension Hypokalemia Laceration of left eyebrow Maxillary sinus fracture, closed, initial encounter (HCC) Traumatic closed nondisp torus fracture of distal radial metaphysis, right, initial encounter Tobacco abuse  Stephanie Phillips is a 68 y.o. female with PMHx significant for history of hypertension hyperlipidemia ongoing tobacco abuse was admitted secondary to syncopal episode. she was walking her dog when she had a mechanical fall, stated that she slipped and fell, denied any loss of consciousness, denied any head injury. Patient feels that while falling she might have used her right wrist to cushion the fall, post fall she had right wrist pain. Patient was able to get up and so she went back to her home, she felt  hot because of the sun and she was wearing mask so felt slightly dizzy, inside the house she had a significant fall on her face with loss of consciousness for few minutes. Patient felt nauseated with the episode but did not had any vomiting.   Patient was noticed to have a laceration around her left eyebrow which was repaired in the ED. X-ray of her right wrist showed patient to have nondisplaced fracture of distal radial metaphysis along with left maxillary sinus fracture and possible orbital fracture.  Syncope was thought to be vasovagal, observed overnight, ruled out for acute coronary syndrome, no obvious arrhythmias, echocardiogram is grossly WNL, carotid Doppler did not show any significant stenosis Regarding right nondisplaced distal radius fracture, Ortho MD Dr Case' PA has seen the patient and recommended nonweightbearing RUE and removable wrist splint and follow-up in 3 to 4 weeks as an outpatient with Dr. Case Regarding her maxillary sinus and left orbital bone fractures, conservative management is recommended, inpatient ENT was not involved as it is not involving any mastication or vision Patient is complaining of cough ever since she was started on lisinopril 3 months ago, wanted to change it to something else, I stopped lisinopril HCTZ combination and replaced by Norvasc 10 mg Patient is given a work note to be off work for 4 weeks and will be reevaluated by Ortho team at that time (she works in post office and involves RUE)   She did have a bone density scan in March 2019, this showed osteopenia-on treatment with Fosamax Ortho follow-up; scheduled for 6/1 Her wrist is a  bit sore but not bad -she wears her splint most of the time, but does take it off to bathe Blood pressure response to medication change- she feels like this is working well for her The cough-attributed to lisinopril- has resolved  She does have a BP cuff but has not checked her BP She has noted some headaches- these are  mild, seem to be getting better Vision is okay  She did have hypokalemia upon admission, but this resolved on discharge labs She is out of work for a few weeks due to her arm injury.  She works with [employer]   Patient Active Problem List   Diagnosis Date Noted  . Hyperlipidemia 06/24/2018  . Pre-diabetes 03/15/2016  . Vitamin D deficiency 03/15/2016  . Gastroesophageal reflux disease 03/15/2016  . Osteoporosis 06/16/2015  . Benign hypertension 04/30/2013  . Current tobacco use 11/04/2012    Past Medical History:  Diagnosis Date  . Arthritis   . Gastroesophageal reflux disease 03/15/2016  . Hypertension   . Osteoporosis 06/16/2015    Past Surgical History:  Procedure Laterality Date  . TONSILLECTOMY      Social History   Tobacco Use  . Smoking status: Current Every Day Smoker    Packs/day: 1.00    Types: Cigarettes  . Smokeless tobacco: Never Used  Substance Use Topics  . Alcohol use: Yes    Alcohol/week: 0.0 standard drinks  . Drug use: No    Family History  Problem Relation Age of Onset  . Hypertension Mother   . Hypertension Father     No Known Allergies  Medication list has been reviewed and updated.  Current Outpatient Medications on File Prior to Visit  Medication Sig Dispense Refill  . alendronate (FOSAMAX) 70 MG tablet TAKE 1 TAB WEEKLY IN AM ON EMPTY STOMACH WITH FULL GLASS OF WATER.DONT LIE DOWN OR EAT FOR 45 MIN. 12 tablet 3  . atorvastatin (LIPITOR) 10 MG tablet Take 1 tablet (10 mg total) by mouth daily. 90 tablet 3  . cetirizine (ZYRTEC) 10 MG tablet Take 1 tablet (10 mg total) by mouth at bedtime as needed (for itching).    Marland Phillips KLOR-CON 10 10 MEQ tablet TAKE 1 TABLET BY MOUTH EVERY DAY 90 tablet 1  . lisinopril-hydrochlorothiazide (PRINZIDE,ZESTORETIC) 10-12.5 MG tablet Take 1 tablet by mouth daily. 90 tablet 3  . naproxen (NAPROSYN) 500 MG tablet Take 1 tablet (500 mg total) by mouth 2 (two) times daily as needed. 60 tablet 2  .  ranitidine (ZANTAC) 150 MG tablet Take 1 tablet (150 mg total) by mouth 2 (two) times daily. 180 tablet 1  . varenicline (CHANTIX STARTING MONTH PAK) 0.5 MG X 11 & 1 MG X 42 tablet Use per package directions. When nearly out please contact me for refill 53 tablet 0  . Vitamin D, Ergocalciferol, (DRISDOL) 50000 units CAPS capsule TAKE 1 CAPSULE (50,000 UNITS TOTAL) BY MOUTH EVERY 7 (SEVEN) DAYS. 12 capsule 0   No current facility-administered medications on file prior to visit.     Review of Systems:  No fever or chills, no cough She is driving   Physical Examination: Vitals:   06/25/18 0933 06/25/18 0945  BP: (!) 142/70 131/78  Pulse: 78   Resp: 16   Temp: 98 F (36.7 C)   SpO2: 98%    Vitals:   06/25/18 0933  Weight: 153 lb (69.4 kg)  Height: 5\' 2"  (1.575 m)   Body mass index is 27.98 kg/m. Ideal Body Weight: Weight  in (lb) to have BMI = 25: 136.4  GEN: WDWN, NAD, Non-toxic, A & O x 3, slight overweight, looks well HEENT: Atraumatic, Normocephalic. Neck supple. No masses, No LAD.  Bilateral TM wnl, oropharynx normal.  PEERL,EOMI.   Healing laceration left brow line Abrasion under left eye-this is in the same location as her facial fracture.  This area is minimally swollen, slightly tender No evidence of nasal fracture, nasal cavity is normal Ears and Nose: No external deformity. CV: RRR, No M/G/R. No JVD. No thrill. No extra heart sounds. PULM: CTA B, no wheezes, crackles, rhonchi. No retractions. No resp. distress. No accessory muscle use. ABD: S, NT, ND, +BS. No rebound. No HSM. EXTR: No c/c/e NEURO Normal gait.  PSYCH: Normally interactive. Conversant. Not depressed or anxious appearing.  Calm demeanor.  Right wrist is minimally swollen and tender.  Hand is warm and well perfused with normal sensation Removed all SI sutures from left brow-wound edges still gaping a bit so I placed Steri-Strips Assessment and Plan: Hospital discharge follow-up  Hyperlipidemia,  unspecified hyperlipidemia type  Dyslipidemia  Essential hypertension  Pre-diabetes  Tobacco abuse  Closed fracture of distal end of right radius, unspecified fracture morphology, initial encounter  Vasovagal syncope  Following up from recent hospitalization.  Marylu LundJanet tripped while walking and broke her wrist.  She then had a vasovagal syncopal episode, hit her head and sustained a laceration and fracture.  Overall she is doing well, healing nicely Her blood pressure medication was changed during hospital stay, blood pressure looks fine Remove sutures from laceration to left brow.  Wound is healing nicely, no sign of infection.  Wound edges are not quite close, but sutures have been in for nearly 10 days so it was time for him to be removed.  We will have her use Steri-Strips to support wound for a few more days Marylu LundJanet will follow-up with orthopedics on June 1 for her wrist fracture.  She will let me know if any other concerns in the meantime.  We have a visit planned for routine follow-up at the end of August  Signed Abbe AmsterdamJessica Copland, MD

## 2018-06-24 NOTE — Patient Instructions (Addendum)
It was great to see you today, but I am sorry you had this fall  Please be sure to follow-up with orthopedics as planned  We removed your sutures today- please do keep some steri strips on your brow for the next 5 days or so to make sure the wound stays closed.  You can put more on if the current strips come off   Let's plan to visit in the office this fall to follow-up   Take care!

## 2018-06-25 ENCOUNTER — Other Ambulatory Visit: Payer: Self-pay

## 2018-06-25 ENCOUNTER — Ambulatory Visit (INDEPENDENT_AMBULATORY_CARE_PROVIDER_SITE_OTHER): Payer: 59 | Admitting: Family Medicine

## 2018-06-25 ENCOUNTER — Encounter: Payer: Self-pay | Admitting: Family Medicine

## 2018-06-25 VITALS — BP 131/78 | HR 78 | Temp 98.0°F | Resp 16 | Ht 62.0 in | Wt 153.0 lb

## 2018-06-25 DIAGNOSIS — I10 Essential (primary) hypertension: Secondary | ICD-10-CM | POA: Diagnosis not present

## 2018-06-25 DIAGNOSIS — S0181XA Laceration without foreign body of other part of head, initial encounter: Secondary | ICD-10-CM

## 2018-06-25 DIAGNOSIS — R55 Syncope and collapse: Secondary | ICD-10-CM | POA: Diagnosis not present

## 2018-06-25 DIAGNOSIS — S52501A Unspecified fracture of the lower end of right radius, initial encounter for closed fracture: Secondary | ICD-10-CM | POA: Diagnosis not present

## 2018-06-25 DIAGNOSIS — E785 Hyperlipidemia, unspecified: Secondary | ICD-10-CM

## 2018-06-25 DIAGNOSIS — Z09 Encounter for follow-up examination after completed treatment for conditions other than malignant neoplasm: Secondary | ICD-10-CM

## 2018-06-30 ENCOUNTER — Ambulatory Visit (HOSPITAL_BASED_OUTPATIENT_CLINIC_OR_DEPARTMENT_OTHER)
Admission: RE | Admit: 2018-06-30 | Discharge: 2018-06-30 | Disposition: A | Discharge status: Home / Self Care | Payer: 59 | Source: Ambulatory Visit | Attending: Family Medicine | Admitting: Family Medicine

## 2018-06-30 ENCOUNTER — Encounter: Payer: Self-pay | Admitting: Family Medicine

## 2018-06-30 ENCOUNTER — Other Ambulatory Visit: Payer: Self-pay

## 2018-06-30 ENCOUNTER — Ambulatory Visit: Payer: 59 | Admitting: Family Medicine

## 2018-06-30 VITALS — BP 117/78 | HR 82 | Ht 62.0 in | Wt 149.0 lb

## 2018-06-30 DIAGNOSIS — S6991XA Unspecified injury of right wrist, hand and finger(s), initial encounter: Secondary | ICD-10-CM | POA: Diagnosis present

## 2018-06-30 NOTE — Progress Notes (Signed)
dg 

## 2018-06-30 NOTE — Progress Notes (Signed)
PCP: Copland, Gwenlyn FoundJessica C, MD  Subjective:   HPI: Patient is a 68 y.o. female here for right wrist fracture.  On 06/16/2018, Patient suffered a fall which, among other injuries, resulted in a distal right radius fracture.  She was discharged from the ED with a removable wrist splint.  Today she reports 1/10 pain at the wrist dorsally, dull.  She has been wearing the brace removing only to apply ice.  Feels better with the brace on.  Some swelling.  No skin changes.  No numbness or tingling.  No erythema.  No bruising.  Past Medical History:  Diagnosis Date  . Arthritis   . Gastroesophageal reflux disease 03/15/2016  . Hypertension   . Osteoporosis 06/16/2015    Current Outpatient Medications on File Prior to Visit  Medication Sig Dispense Refill  . alendronate (FOSAMAX) 70 MG tablet TAKE 1 TAB WEEKLY IN AM ON EMPTY STOMACH WITH FULL GLASS OF WATER.DONT LIE DOWN OR EAT FOR 45 MIN. 12 tablet 3  . atorvastatin (LIPITOR) 10 MG tablet Take 1 tablet (10 mg total) by mouth daily. 90 tablet 3  . cetirizine (ZYRTEC) 10 MG tablet Take 1 tablet (10 mg total) by mouth at bedtime as needed (for itching).    Marland Kitchen. KLOR-CON 10 10 MEQ tablet TAKE 1 TABLET BY MOUTH EVERY DAY 90 tablet 1  . lisinopril-hydrochlorothiazide (PRINZIDE,ZESTORETIC) 10-12.5 MG tablet Take 1 tablet by mouth daily. 90 tablet 3  . naproxen (NAPROSYN) 500 MG tablet Take 1 tablet (500 mg total) by mouth 2 (two) times daily as needed. 60 tablet 2  . ranitidine (ZANTAC) 150 MG tablet Take 1 tablet (150 mg total) by mouth 2 (two) times daily. 180 tablet 1  . varenicline (CHANTIX STARTING MONTH PAK) 0.5 MG X 11 & 1 MG X 42 tablet Use per package directions. When nearly out please contact me for refill 53 tablet 0  . Vitamin D, Ergocalciferol, (DRISDOL) 50000 units CAPS capsule TAKE 1 CAPSULE (50,000 UNITS TOTAL) BY MOUTH EVERY 7 (SEVEN) DAYS. 12 capsule 0   No current facility-administered medications on file prior to visit.     Past Surgical  History:  Procedure Laterality Date  . TONSILLECTOMY      No Known Allergies  Social History   Socioeconomic History  . Marital status: Single    Spouse name: Not on file  . Number of children: Not on file  . Years of education: Not on file  . Highest education level: Not on file  Occupational History  . Not on file  Social Needs  . Financial resource strain: Not on file  . Food insecurity:    Worry: Not on file    Inability: Not on file  . Transportation needs:    Medical: Not on file    Non-medical: Not on file  Tobacco Use  . Smoking status: Current Every Day Smoker    Packs/day: 1.00    Types: Cigarettes  . Smokeless tobacco: Never Used  Substance and Sexual Activity  . Alcohol use: Yes    Alcohol/week: 0.0 standard drinks  . Drug use: No  . Sexual activity: Not on file  Lifestyle  . Physical activity:    Days per week: Not on file    Minutes per session: Not on file  . Stress: Not on file  Relationships  . Social connections:    Talks on phone: Not on file    Gets together: Not on file    Attends religious service: Not on file  Active member of club or organization: Not on file    Attends meetings of clubs or organizations: Not on file    Relationship status: Not on file  . Intimate partner violence:    Fear of current or ex partner: Not on file    Emotionally abused: Not on file    Physically abused: Not on file    Forced sexual activity: Not on file  Other Topics Concern  . Not on file  Social History Narrative  . Not on file    Family History  Problem Relation Age of Onset  . Hypertension Mother   . Hypertension Father     BP 117/78   Pulse 82   Ht 5\' 2"  (1.575 m)   Wt 149 lb (67.6 kg)   BMI 27.25 kg/m   Review of Systems: See HPI above.     Objective:  Physical Exam:  Gen: awake, alert, NAD, comfortable in exam room Pulm: breathing unlabored  Right Wrist/Hand: Inspection: No obvious deformity. Slight circumferential  swelling. No bruising or erythema Palpation: no TTP ROM: full ROM of the fingers. Wrist motion not tested Strength: 5/5 strength in the hand, forearm/wrist strength deferred Neurovascular: NV intact  Left wrist/hand: No deformity or swelling No TTP Full ROM with 5/5 strength in the hand NV intact.   Assessment & Plan:  1. nondisplaced distal right radius fracture. Pt is doing well. xrays obtained today and independently reviewed which show a nondisplaced distal radius fracture - Patient is fit with medium Exos splint - Tylenol as needed - f/u in 2 weeks

## 2018-06-30 NOTE — Patient Instructions (Addendum)
Wear the EXOS brace regularly - you'll need this for 4 more weeks. Elevate above your heart as needed. Tylenol, rare aleve if needed for pain. Follow up with me in 2 weeks.

## 2018-07-01 ENCOUNTER — Encounter: Payer: Self-pay | Admitting: Family Medicine

## 2018-07-11 ENCOUNTER — Telehealth: Payer: Self-pay | Admitting: Family Medicine

## 2018-07-11 DIAGNOSIS — I10 Essential (primary) hypertension: Secondary | ICD-10-CM

## 2018-07-11 MED ORDER — AMLODIPINE BESYLATE 10 MG PO TABS: 10.0000 mg | ORAL_TABLET | Freq: Every day | ORAL | 3 refills | Status: DC

## 2018-07-11 NOTE — Telephone Encounter (Signed)
Copied from CRM [id number]. Topic: Quick Communication - Rx Refill/Question >> Jul 11, 2018  9:01 AM Gwenlyn Fudge A wrote: Medication: amlodipine  Has the patient contacted their pharmacy? Yes.  Pt states they changed her medication in the hospital and now she needs a refill. Pt also had a question about a multi vitamin that she was advised to take and is concerned 2 ingredients may react with the medications she is currently taking. She is requesting a call back to answer questions. Please advise.  (Agent: If no, request that the patient contact the pharmacy for the refill.) (Agent: If yes, when and what did the pharmacy advise?)  Preferred Pharmacy (with phone number or street name): [id number], [place] - [address] [place] [address] Phone: [phone] Fax: [phone] Not a 24 hour pharmacy; exact hours not known.    Agent: Please be advised that RX refills may take up to 3 business days. We ask that you follow-up with your pharmacy.

## 2018-07-11 NOTE — Telephone Encounter (Signed)
Called her back- her BP med was changed to amlodipine during recent hospital stay and she needs this med rx. Will send in for her now Answered question about supplement

## 2018-07-14 ENCOUNTER — Ambulatory Visit: Payer: 59 | Admitting: Family Medicine

## 2018-07-14 ENCOUNTER — Other Ambulatory Visit: Payer: Self-pay

## 2018-07-14 ENCOUNTER — Encounter: Payer: Self-pay | Admitting: Family Medicine

## 2018-07-14 ENCOUNTER — Ambulatory Visit (HOSPITAL_BASED_OUTPATIENT_CLINIC_OR_DEPARTMENT_OTHER)
Admission: RE | Admit: 2018-07-14 | Discharge: 2018-07-14 | Disposition: A | Discharge status: Home / Self Care | Payer: 59 | Source: Ambulatory Visit | Attending: Family Medicine | Admitting: Family Medicine

## 2018-07-14 VITALS — BP 146/89 | HR 71 | Ht 62.0 in | Wt 149.0 lb

## 2018-07-14 DIAGNOSIS — S6991XD Unspecified injury of right wrist, hand and finger(s), subsequent encounter: Secondary | ICD-10-CM | POA: Diagnosis not present

## 2018-07-14 NOTE — Patient Instructions (Signed)
You're doing great! Wear the brace for 2 more weeks then follow up with me then. Tylenol if needed. Ok to return to work with the brace on.

## 2018-07-14 NOTE — Progress Notes (Signed)
PCP: Copland, Gwenlyn Found, MD  Subjective:   HPI: Patient is a 68 y.o. female here for right wrist fracture.  5/18: On 06/16/2018, Patient suffered a fall which, among other injuries, resulted in a distal right radius fracture.  She was discharged from the ED with a removable wrist splint.  Today she reports 1/10 pain at the wrist dorsally, dull.  She has been wearing the brace removing only to apply ice.  Feels better with the brace on.  Some swelling.  No skin changes.  No numbness or tingling.  No erythema.  No bruising.  6/1: Patient returns today for follow-up for right distal radius fracture.  Today she reports 1/10 pain.  She does note continued swelling.  She has been compliant in her wear of the EXOS brace.  No distal numbness or tingling.  Patient is requesting to return to work today.  Past Medical History:  Diagnosis Date  . Arthritis   . Gastroesophageal reflux disease 03/15/2016  . Hypertension   . Osteoporosis 06/16/2015    Current Outpatient Medications on File Prior to Visit  Medication Sig Dispense Refill  . alendronate (FOSAMAX) 70 MG tablet TAKE 1 TAB WEEKLY IN AM ON EMPTY STOMACH WITH FULL GLASS OF WATER.DONT LIE DOWN OR EAT FOR 45 MIN. 12 tablet 3  . amLODipine (NORVASC) 10 MG tablet Take 1 tablet (10 mg total) by mouth daily. 90 tablet 3  . atorvastatin (LIPITOR) 10 MG tablet Take 1 tablet (10 mg total) by mouth daily. 90 tablet 3  . cetirizine (ZYRTEC) 10 MG tablet Take 1 tablet (10 mg total) by mouth at bedtime as needed (for itching).    Marland Kitchen KLOR-CON 10 10 MEQ tablet TAKE 1 TABLET BY MOUTH EVERY DAY 90 tablet 1  . naproxen (NAPROSYN) 500 MG tablet Take 1 tablet (500 mg total) by mouth 2 (two) times daily as needed. 60 tablet 2  . ranitidine (ZANTAC) 150 MG tablet Take 1 tablet (150 mg total) by mouth 2 (two) times daily. 180 tablet 1  . varenicline (CHANTIX STARTING MONTH PAK) 0.5 MG X 11 & 1 MG X 42 tablet Use per package directions. When nearly out please contact me  for refill 53 tablet 0  . Vitamin D, Ergocalciferol, (DRISDOL) 50000 units CAPS capsule TAKE 1 CAPSULE (50,000 UNITS TOTAL) BY MOUTH EVERY 7 (SEVEN) DAYS. 12 capsule 0   No current facility-administered medications on file prior to visit.     Past Surgical History:  Procedure Laterality Date  . TONSILLECTOMY      No Known Allergies  Social History   Socioeconomic History  . Marital status: Single    Spouse name: Not on file  . Number of children: Not on file  . Years of education: Not on file  . Highest education level: Not on file  Occupational History  . Not on file  Social Needs  . Financial resource strain: Not on file  . Food insecurity:    Worry: Not on file    Inability: Not on file  . Transportation needs:    Medical: Not on file    Non-medical: Not on file  Tobacco Use  . Smoking status: Current Every Day Smoker    Packs/day: 1.00    Types: Cigarettes  . Smokeless tobacco: Never Used  Substance and Sexual Activity  . Alcohol use: Yes    Alcohol/week: 0.0 standard drinks  . Drug use: No  . Sexual activity: Not on file  Lifestyle  . Physical activity:  Days per week: Not on file    Minutes per session: Not on file  . Stress: Not on file  Relationships  . Social connections:    Talks on phone: Not on file    Gets together: Not on file    Attends religious service: Not on file    Active member of club or organization: Not on file    Attends meetings of clubs or organizations: Not on file    Relationship status: Not on file  . Intimate partner violence:    Fear of current or ex partner: Not on file    Emotionally abused: Not on file    Physically abused: Not on file    Forced sexual activity: Not on file  Other Topics Concern  . Not on file  Social History Narrative  . Not on file    Family History  Problem Relation Age of Onset  . Hypertension Mother   . Hypertension Father     BP (!) 146/89   Pulse 71   Ht 5\' 2"  (1.575 m)   Wt 149 lb  (67.6 kg)   BMI 27.25 kg/m   Review of Systems: See HPI above.     Objective:  Physical Exam:  GEN: awake, alert, NAD Pulm: breathing unlabored  Right Wrist/Hand: Inspection: No obvious deformity. Swelling around the distal radius. Palpation: no TTP ROM: limited ROM of the wrist Strength: no focal hand muscle weakness Neurovascular: NV intact  Assessment & Plan:  1.  Nondisplaced right distal radius fracture-patient continues to do well.  Repeat x-rays were obtained today and independently reviewed which shows interval healing. -Continue exos brace for 2 more weeks -Follow-up in 2 weeks and expect to discontinue brace and start on home range of motion and strength rehab.

## 2018-07-15 ENCOUNTER — Encounter: Payer: Self-pay | Admitting: Family Medicine

## 2018-07-28 ENCOUNTER — Encounter: Payer: Self-pay | Admitting: Family Medicine

## 2018-07-28 ENCOUNTER — Other Ambulatory Visit: Payer: Self-pay

## 2018-07-28 ENCOUNTER — Ambulatory Visit: Payer: 59 | Admitting: Family Medicine

## 2018-07-28 VITALS — BP 154/91 | HR 81 | Ht 62.0 in | Wt 147.0 lb

## 2018-07-28 DIAGNOSIS — S6991XD Unspecified injury of right wrist, hand and finger(s), subsequent encounter: Secondary | ICD-10-CM

## 2018-07-28 NOTE — Patient Instructions (Signed)
You're doing great! Do wrist circles and up/downs at least a couple times a day. Wear wrist brace during the day but take this off for periods to let this area on your wrist air out. Continue work restrictions for 2 more weeks. Follow up with me as needed otherwise.

## 2018-07-28 NOTE — Progress Notes (Signed)
PCP: Copland, Gay Filler, MD  Subjective:   HPI: Patient is a 68 y.o. female here for right wrist fracture.  5/18: On 06/16/2018, Patient suffered a fall which, among other injuries, resulted in a distal right radius fracture.  She was discharged from the ED with a removable wrist splint.  Today she reports 1/10 pain at the wrist dorsally, dull.  She has been wearing the brace removing only to apply ice.  Feels better with the brace on.  Some swelling.  No skin changes.  No numbness or tingling.  No erythema.  No bruising.  6/1: Patient returns today for follow-up for right distal radius fracture.  Today she reports 1/10 pain.  She does note continued swelling.  She has been compliant in her wear of the EXOS brace.  No distal numbness or tingling.  Patient is requesting to return to work today.  6/15: Pt returns today for right distal radius fracture. She is doing well today and reports no further pain. Swelling has significantly improved. She continues to wear the exos brace. No distal numbness or tingling. She has returned to work previously and has been doing well with this.  Past Medical History:  Diagnosis Date  . Arthritis   . Gastroesophageal reflux disease 03/15/2016  . Hypertension   . Osteoporosis 06/16/2015    Current Outpatient Medications on File Prior to Visit  Medication Sig Dispense Refill  . alendronate (FOSAMAX) 70 MG tablet TAKE 1 TAB WEEKLY IN AM ON EMPTY STOMACH WITH FULL GLASS OF WATER.DONT LIE DOWN OR EAT FOR 45 MIN. 12 tablet 3  . amLODipine (NORVASC) 10 MG tablet Take 1 tablet (10 mg total) by mouth daily. 90 tablet 3  . atorvastatin (LIPITOR) 10 MG tablet Take 1 tablet (10 mg total) by mouth daily. 90 tablet 3  . cetirizine (ZYRTEC) 10 MG tablet Take 1 tablet (10 mg total) by mouth at bedtime as needed (for itching).    Marland Kitchen KLOR-CON 10 10 MEQ tablet TAKE 1 TABLET BY MOUTH EVERY DAY 90 tablet 1  . naproxen (NAPROSYN) 500 MG tablet Take 1 tablet (500 mg total) by mouth 2  (two) times daily as needed. 60 tablet 2  . ranitidine (ZANTAC) 150 MG tablet Take 1 tablet (150 mg total) by mouth 2 (two) times daily. 180 tablet 1  . varenicline (CHANTIX STARTING MONTH PAK) 0.5 MG X 11 & 1 MG X 42 tablet Use per package directions. When nearly out please contact me for refill 53 tablet 0  . Vitamin D, Ergocalciferol, (DRISDOL) 50000 units CAPS capsule TAKE 1 CAPSULE (50,000 UNITS TOTAL) BY MOUTH EVERY 7 (SEVEN) DAYS. 12 capsule 0   No current facility-administered medications on file prior to visit.     Past Surgical History:  Procedure Laterality Date  . TONSILLECTOMY      No Known Allergies  Social History   Socioeconomic History  . Marital status: Single    Spouse name: Not on file  . Number of children: Not on file  . Years of education: Not on file  . Highest education level: Not on file  Occupational History  . Not on file  Social Needs  . Financial resource strain: Not on file  . Food insecurity    Worry: Not on file    Inability: Not on file  . Transportation needs    Medical: Not on file    Non-medical: Not on file  Tobacco Use  . Smoking status: Current Every Day Smoker  Packs/day: 1.00    Types: Cigarettes  . Smokeless tobacco: Never Used  Substance and Sexual Activity  . Alcohol use: Yes    Alcohol/week: 0.0 standard drinks  . Drug use: No  . Sexual activity: Not on file  Lifestyle  . Physical activity    Days per week: Not on file    Minutes per session: Not on file  . Stress: Not on file  Relationships  . Social Musicianconnections    Talks on phone: Not on file    Gets together: Not on file    Attends religious service: Not on file    Active member of club or organization: Not on file    Attends meetings of clubs or organizations: Not on file    Relationship status: Not on file  . Intimate partner violence    Fear of current or ex partner: Not on file    Emotionally abused: Not on file    Physically abused: Not on file     Forced sexual activity: Not on file  Other Topics Concern  . Not on file  Social History Narrative  . Not on file    Family History  Problem Relation Age of Onset  . Hypertension Mother   . Hypertension Father     There were no vitals taken for this visit.  Review of Systems: See HPI above.     Objective:  Physical Exam:  Gen: awake alert, NAD Pulm: breathing unlabored  Right Wrist/Hand: Inspection: No obvious deformity. No swelling, erythema or bruising  White skin discoloration over hypothenar eminence. Palpation: no focal TTP ROM: decreased ROM in extension, slightly reduced flexion Strength: 4+/5 wrist strength Neurovascular: NV intact   Assessment & Plan:  1.  Nondisplaced right distal radius fracture - now 6 weeks post injury and doing well.  - transition to volar wrist brace  - home exercises to work on ROM and strengthening - f/u as needed

## 2018-09-04 ENCOUNTER — Telehealth: Payer: Self-pay | Admitting: Family Medicine

## 2018-09-04 MED ORDER — FLUCONAZOLE 150 MG PO TABS: 150.0000 mg | ORAL_TABLET | Freq: Once | ORAL | 0 refills | Status: AC

## 2018-09-04 NOTE — Telephone Encounter (Signed)
Copied from [place] [phone]. Topic: Quick Communication - Rx Refill/Question >> Sep 04, 2018  1:25 PM Stephanie Phillips, Marland Kitchen wrote: Medication: something for yeast infection   Has the patient contacted their pharmacy? No. (Agent: If no, request that the patient contact the pharmacy for the refill.) (Agent: If yes, when and what did the pharmacy advise?)  Preferred Pharmacy (with phone number or street name): [facility] [address] Pt was given abx for tooth infection an now has yeast infection    Agent: Please be advised that RX refills may take up to 3 business days. We ask that you follow-up with your pharmacy.

## 2018-09-04 NOTE — Telephone Encounter (Signed)
Please advise 

## 2018-09-25 ENCOUNTER — Other Ambulatory Visit: Payer: Self-pay

## 2018-09-25 MED ORDER — FLUCONAZOLE 150 MG PO TABS: 150.0000 mg | ORAL_TABLET | Freq: Once | ORAL | 0 refills | Status: AC

## 2018-09-28 NOTE — Patient Instructions (Addendum)
It was nice to see you today You might consider getting the shingles vaccine at your drugstore at your convenience  I will be in touch with your labs Let's visit in 6 months for a physical Don't forget your mammogram this fall!

## 2018-09-28 NOTE — Progress Notes (Addendum)
[facility] [address], [address] [place], [place] [address] [phone] [phone]  Date:  10/06/2018   Name:  Stephanie PostJanet Phillips   DOB:  22-Mar-1950   MRN:  [id number]  PCP:  Stephanie Phillips,  C, MD    Chief Complaint: Hypertension (6 month follow up)   History of Present Illness:  Stephanie Phillips is a 68 y.o. very pleasant female patient who presents with the following:  Here today for 4181-month routine follow-up History of hypertension, prediabetes, hyperlipidemia, GERD, osteoporosis She works for the [employer]- she does not plan to retire anytime soon   Lab Results  Component Value Date   HGBA1C 5.9 04/07/2018   I actually did see her in May for a virtual visit [facility]-she tripped while walking and broke her wrist, and then fainted at home, hitting her head and sustained a facial fracture.  Her wrist fracture was managed by Dr Stephanie Phillips with sports med  Her wrist and facial fracture have recovered   Mammogram done October 2018- she reports that she [facility]  She will schedule this  DEXA March 2019 Can suggest Shingrix [place]-at that time her cholesterol looked okay Declines fu shot   Fosamax Amlodipine 10 Lipitor 10 Potassium- NOT taking She is taking calcium and vit D OTC  She does not check her home BP She notes "it is high today because I was rushing and got a couple of things on my mind." She would like a refill of triamcinolone which she uses on occasion for itching   BP Readings from Last 3 Encounters:  10/06/18 120/78  07/28/18 (!) 154/91  07/14/18 (!) 146/89    Patient Active Problem List   Diagnosis Date Noted  . Hyperlipidemia 06/24/2018  . Pre-diabetes 03/15/2016  . Vitamin D deficiency 03/15/2016  . Gastroesophageal reflux disease 03/15/2016  . Osteoporosis 06/16/2015  . Benign hypertension 04/30/2013  . Current tobacco use  11/04/2012    Past Medical History:  Diagnosis Date  . Arthritis   . Gastroesophageal reflux disease 03/15/2016  . Hypertension   . Osteoporosis 06/16/2015    Past Surgical History:  Procedure Laterality Date  . TONSILLECTOMY      Social History   Tobacco Use  . Smoking status: Current Every Day Smoker    Packs/day: 1.00    Types: Cigarettes  . Smokeless tobacco: Never Used  Substance Use Topics  . Alcohol use: Yes    Alcohol/week: 0.0 standard drinks  . Drug use: No    Family History  Problem Relation Age of Onset  . Hypertension Mother   . Hypertension Father     No Known Allergies  Medication list has been reviewed and updated.  Current Outpatient Medications on File Prior to Visit  Medication Sig Dispense Refill  . alendronate (FOSAMAX) 70 MG tablet TAKE 1 TAB WEEKLY IN AM ON EMPTY STOMACH WITH FULL GLASS OF WATER.DONT LIE DOWN OR EAT FOR 45 MIN. 12 tablet 3  . amLODipine (NORVASC) 10 MG tablet Take 1 tablet (10 mg total) by mouth daily. 90 tablet 3  . atorvastatin (LIPITOR) 10 MG tablet Take 1 tablet (10 mg total) by mouth daily. 90 tablet 3  . varenicline (CHANTIX STARTING MONTH PAK) 0.5 MG X 11 & 1 MG X 42 tablet Use per package directions. When nearly out please contact me for refill 53 tablet 0  . Vitamin D,  Ergocalciferol, (DRISDOL) 50000 units CAPS capsule TAKE 1 CAPSULE (50,000 UNITS TOTAL) BY MOUTH EVERY 7 (SEVEN) DAYS. 12 capsule 0   No current facility-administered medications on file prior to visit.     Review of Systems:  As per HPI- otherwise negative. No CP or SOB with exercise No PMB   Physical Examination: Vitals:   10/06/18 0820 10/06/18 0835  BP: (!) 154/80 120/78  Pulse: 63   Resp: 16   Temp: 97.6 F (36.4 C)   SpO2: 96%    Vitals:   10/06/18 0820  Weight: 152 lb (68.9 kg)  Height: 5\' 2"  (1.575 m)   Body mass index is 27.8 kg/m. Ideal Body Weight: Weight in (lb) to have BMI = 25: 136.4  GEN: WDWN, NAD, Non-toxic, A & O x  3, normal weight, looks well - fit and spry for age  22: Atraumatic, Normocephalic. Neck supple. No masses, No LAD. TM wnl  Ears and Nose: No external deformity. CV: RRR, No M/G/R. No JVD. No thrill. No extra heart sounds. PULM: CTA B, no wheezes, crackles, rhonchi. No retractions. No resp. distress. No accessory muscle use. ABD: S, NT, ND EXTR: No c/c/e NEURO Normal gait.  PSYCH: Normally interactive. Conversant. Not depressed or anxious appearing.  Calm demeanor.    Assessment and Plan:   ICD-10-CM   1. Essential hypertension  R42 Basic metabolic panel  2. Hypokalemia  H06.2 Basic metabolic panel  3. History of vitamin D deficiency  Z86.39 Vitamin D (25 hydroxy)  4. Skin irritation  R23.8 triamcinolone cream (KENALOG) 0.1 %  5. Hyperlipidemia, unspecified hyperlipidemia type  E78.5    Following up today BP under ok control Taking OTC vit D-check level today No longer taking K- check K level today Declines flu shot Reminded to get mammo this fall CPE in March   Follow-up: No follow-ups on file.  Meds ordered this encounter  Medications  . triamcinolone cream (KENALOG) 0.1 %    Sig: Apply 1 application topically 2 (two) times daily.    Dispense:  30 g    Refill:  1   Orders Placed This Encounter  Procedures  . Vitamin D (25 hydroxy)  . Basic metabolic panel     Signed Lamar Blinks, MD Received her labs 8/25- message to pt  Results for orders placed or performed in visit on 10/06/18  Vitamin D (25 hydroxy)  Result Value Ref Range   VITD 29.85 (L) 30.00 - 100.00 ng/mL  Basic metabolic panel  Result Value Ref Range   Sodium 142 135 - 145 mEq/L   Potassium 3.6 3.5 - 5.1 mEq/L   Chloride 105 96 - 112 mEq/L   CO2 29 19 - 32 mEq/L   Glucose, Bld 103 (H) 70 - 99 mg/dL   BUN 9 6 - 23 mg/dL   Creatinine, Ser 0.82 0.40 - 1.20 mg/dL   Calcium 9.4 8.4 - 10.5 mg/dL   GFR 84.46 >60.00 mL/min

## 2018-10-06 ENCOUNTER — Other Ambulatory Visit: Payer: Self-pay

## 2018-10-06 ENCOUNTER — Ambulatory Visit: Payer: 59 | Admitting: Family Medicine

## 2018-10-06 ENCOUNTER — Encounter: Payer: Self-pay | Admitting: Family Medicine

## 2018-10-06 VITALS — BP 120/78 | HR 63 | Temp 97.6°F | Resp 16 | Ht 62.0 in | Wt 152.0 lb

## 2018-10-06 DIAGNOSIS — Z8639 Personal history of other endocrine, nutritional and metabolic disease: Secondary | ICD-10-CM | POA: Diagnosis not present

## 2018-10-06 DIAGNOSIS — E876 Hypokalemia: Secondary | ICD-10-CM

## 2018-10-06 DIAGNOSIS — I10 Essential (primary) hypertension: Secondary | ICD-10-CM

## 2018-10-06 DIAGNOSIS — E785 Hyperlipidemia, unspecified: Secondary | ICD-10-CM

## 2018-10-06 DIAGNOSIS — R238 Other skin changes: Secondary | ICD-10-CM

## 2018-10-06 LAB — BASIC METABOLIC PANEL
BUN: 9 mg/dL (ref 6–23)
CO2: 29 mEq/L (ref 19–32)
Calcium: 9.4 mg/dL (ref 8.4–10.5)
Chloride: 105 mEq/L (ref 96–112)
Creatinine, Ser: 0.82 mg/dL (ref 0.40–1.20)
GFR: 84.46 mL/min (ref >60.00)
Glucose, Bld: 103 mg/dL — ABNORMAL HIGH (ref 70–99)
Potassium: 3.6 mEq/L (ref 3.5–5.1)
Sodium: 142 mEq/L (ref 135–145)

## 2018-10-06 MED ORDER — TRIAMCINOLONE ACETONIDE 0.1 % EX CREA: 1.0000 "application " | TOPICAL_CREAM | Freq: Two times a day (BID) | CUTANEOUS | 1 refills | Status: DC

## 2018-10-07 LAB — VITAMIN D 25 HYDROXY (VIT D DEFICIENCY, FRACTURES): VITD: 29.85 ng/mL — ABNORMAL LOW (ref 30.00–100.00)

## 2018-11-28 ENCOUNTER — Other Ambulatory Visit: Payer: Self-pay | Admitting: Family Medicine

## 2019-04-11 NOTE — Progress Notes (Addendum)
[facility] [address] [place], [place] [address] [phone]  Date:  04/13/2019   Name:  Stephanie Phillips   DOB:  [birth year]   MRN:  [id number]  PCP:  Darreld Mclean, MD    Chief Complaint: Annual Exam   History of Present Illness:  Stephanie Phillips is a 69 y.o. very pleasant female patient who presents with the following:  Patient with history of hypertension, GERD, prediabetes, hyperlipidemia, osteoporosis, here today for routine physical  Last seen by myself in August 2020 She works for the [employer]- she does not plan to retire anytime soon  Last year she fell, broke her wrist and a facial bone.  She is healed and well  Flu shot- pt declines  Mammogram up-to-date Colon cancer screen up-to-date Pneumonia vaccines done Second Shingrix done last week Needs full labs today- she is not quite fasting today  Most recent DEXA scan 2 years ago, can update now  Fosamax Amlodipine 10 Lipitor Vit D def- she is taking otc vit D   She is smoking- would like to try chantix again  She has smoked for about 43 years- about 1 PPD  Patient Active Problem List   Diagnosis Date Noted  . Hyperlipidemia 06/24/2018  . Pre-diabetes 03/15/2016  . Vitamin D deficiency 03/15/2016  . Gastroesophageal reflux disease 03/15/2016  . Osteoporosis 06/16/2015  . Benign hypertension 04/30/2013  . Current tobacco use 11/04/2012    Past Medical History:  Diagnosis Date  . Arthritis   . Gastroesophageal reflux disease 03/15/2016  . Hypertension   . Osteoporosis 06/16/2015    Past Surgical History:  Procedure Laterality Date  . TONSILLECTOMY      Social History   Tobacco Use  . Smoking status: Current Every Day Smoker    Packs/day: 1.00    Types: Cigarettes  . Smokeless tobacco: Never Used  Substance Use Topics  . Alcohol use: Yes    Alcohol/week: 0.0 standard drinks  . Drug use: No    Family History  Problem  Relation Age of Onset  . Hypertension Mother   . Hypertension Father     No Known Allergies  Medication list has been reviewed and updated.  Current Outpatient Medications on File Prior to Visit  Medication Sig Dispense Refill  . alendronate (FOSAMAX) 70 MG tablet TAKE 1 TAB WEEKLY IN AM ON EMPTY STOMACH WITH FULL GLASS OF WATER.DONT LIE DOWN OR EAT FOR 45 MIN. 12 tablet 3  . triamcinolone cream (KENALOG) 0.1 % Apply 1 application topically 2 (two) times daily. 30 g 1  . Vitamin D, Ergocalciferol, (DRISDOL) 50000 units CAPS capsule TAKE 1 CAPSULE (50,000 UNITS TOTAL) BY MOUTH EVERY 7 (SEVEN) DAYS. 12 capsule 0   No current facility-administered medications on file prior to visit.    Review of Systems:  As per HPI- otherwise negative.   Physical Examination: Vitals:   04/13/19 0817  BP: (!) 148/80  Pulse: 86  Resp: 16  Temp: 98.4 F (36.9 C)  SpO2: 98%   Vitals:   04/13/19 0817  Weight: 150 lb (68 kg)  Height: 5\' 2"  (1.575 m)   Body mass index is 27.44 kg/m. Ideal Body Weight: Weight in (lb) to have BMI = 25: 136.4  GEN: no acute distress.  Normal weight for age, looks well HEENT: Atraumatic, Normocephalic.  TM within normal limits bilaterally Ears and Nose: No external deformity. CV: RRR, No M/G/R. No JVD. No thrill.  No extra heart sounds. PULM: CTA B, no wheezes, crackles, rhonchi. No retractions. No resp. distress. No accessory muscle use. ABD: S, NT, ND, +BS. No rebound. No HSM. EXTR: No c/c/e PSYCH: Normally interactive. Conversant.    Assessment and Plan: Physical exam  History of vitamin D deficiency - Plan: Vitamin D (25 hydroxy)  Essential hypertension - Plan: CBC, Comprehensive metabolic panel, amLODipine (NORVASC) 10 MG tablet  Dyslipidemia - Plan: Lipid panel, atorvastatin (LIPITOR) 10 MG tablet  Age-related osteoporosis without current pathological fracture - Plan: DG Bone Density  Gastroesophageal reflux disease, unspecified whether  esophagitis present  Pre-diabetes - Plan: Hemoglobin A1c  Screening for thyroid disorder - Plan: TSH  Tobacco abuse - Plan: CT CHEST LUNG CA SCREEN LOW DOSE W/O CM, varenicline (CHANTIX STARTING MONTH PAK) 0.5 MG X 11 & 1 MG X 42 tablet  Encounter for screening for lung cancer - Plan: CT CHEST LUNG CA SCREEN LOW DOSE W/O CM  Here today for routine physical Declines flu shot Shingrix complete, I encouraged her to get the Covid vaccine ASAP Blood pressure under okay control, continue amlodipine DEXA scan, continue Fosamax Ordered CT lung cancer screening, she would like to try Chantix again.  I prescribed starter pack  Will plan further follow- up pending labs.   This visit occurred during the SARS-CoV-2 public health emergency.  Safety protocols were in place, including screening questions prior to the visit, additional usage of staff PPE, and extensive cleaning of exam room while observing appropriate contact time as indicated for disinfecting solutions.    Signed Abbe Amsterdam, MD  Received her labs, letter to pt  Results for orders placed or performed in visit on 04/13/19  CBC  Result Value Ref Range   WBC 6.8 4.0 - 10.5 K/uL   RBC 3.81 (L) 3.87 - 5.11 Mil/uL   Platelets 307.0 150.0 - 400.0 K/uL   Hemoglobin 11.7 (L) 12.0 - 15.0 g/dL   HCT 53.6 (L) 14.4 - 31.5 %   MCV 90.4 78.0 - 100.0 fl   MCHC 34.1 30.0 - 36.0 g/dL   RDW 40.0 86.7 - 61.9 %  Comprehensive metabolic panel  Result Value Ref Range   Sodium 141 135 - 145 mEq/L   Potassium 3.6 3.5 - 5.1 mEq/L   Chloride 103 96 - 112 mEq/L   CO2 29 19 - 32 mEq/L   Glucose, Bld 110 (H) 70 - 99 mg/dL   BUN 9 6 - 23 mg/dL   Creatinine, Ser 5.09 0.40 - 1.20 mg/dL   Total Bilirubin 0.2 0.2 - 1.2 mg/dL   Alkaline Phosphatase 92 39 - 117 U/L   AST 20 0 - 37 U/L   ALT 19 0 - 35 U/L   Total Protein 6.7 6.0 - 8.3 g/dL   Albumin 3.8 3.5 - 5.2 g/dL   GFR 32.67 >12.45 mL/min   Calcium 9.1 8.4 - 10.5 mg/dL  Hemoglobin Y0D   Result Value Ref Range   Hgb A1c MFr Bld 6.1 4.6 - 6.5 %  Lipid panel  Result Value Ref Range   Cholesterol 101 0 - 200 mg/dL   Triglycerides 98.3 0.0 - 149.0 mg/dL   HDL 38.25 >05.39 mg/dL   VLDL 76.7 0.0 - 34.1 mg/dL   LDL Cholesterol 43 0 - 99 mg/dL   Total CHOL/HDL Ratio 2    NonHDL 55.97   TSH  Result Value Ref Range   TSH 0.37 0.35 - 4.50 uIU/mL  Vitamin D (25 hydroxy)  Result Value Ref Range  VITD 38.70 30.00 - 100.00 ng/mL

## 2019-04-11 NOTE — Patient Instructions (Addendum)
This great to see you again today, I will be in touch with your labs as soon as possible  We will set you up for a lung cancer screening CT and bone density scan I would encourage you to get your covid vaccine asap   Health Maintenance After Age 67 After age 14, you are at a higher risk for certain long-term diseases and infections as well as injuries from falls. Falls are a major cause of broken bones and head injuries in people who are older than age 55. Getting regular preventive care can help to keep you healthy and well. Preventive care includes getting regular testing and making lifestyle changes as recommended by your health care provider. Talk with your health care provider about:  Which screenings and tests you should have. A screening is a test that checks for a disease when you have no symptoms.  A diet and exercise plan that is right for you. What should I know about screenings and tests to prevent falls? Screening and testing are the best ways to find a health problem early. Early diagnosis and treatment give you the best chance of managing medical conditions that are common after age 50. Certain conditions and lifestyle choices may make you more likely to have a fall. Your health care provider may recommend:  Regular vision checks. Poor vision and conditions such as cataracts can make you more likely to have a fall. If you wear glasses, make sure to get your prescription updated if your vision changes.  Medicine review. Work with your health care provider to regularly review all of the medicines you are taking, including over-the-counter medicines. Ask your health care provider about any side effects that may make you more likely to have a fall. Tell your health care provider if any medicines that you take make you feel dizzy or sleepy.  Osteoporosis screening. Osteoporosis is a condition that causes the bones to get weaker. This can make the bones weak and cause them to break more  easily.  Blood pressure screening. Blood pressure changes and medicines to control blood pressure can make you feel dizzy.  Strength and balance checks. Your health care provider may recommend certain tests to check your strength and balance while standing, walking, or changing positions.  Foot health exam. Foot pain and numbness, as well as not wearing proper footwear, can make you more likely to have a fall.  Depression screening. You may be more likely to have a fall if you have a fear of falling, feel emotionally low, or feel unable to do activities that you used to do.  Alcohol use screening. Using too much alcohol can affect your balance and may make you more likely to have a fall. What actions can I take to lower my risk of falls? General instructions  Talk with your health care provider about your risks for falling. Tell your health care provider if: ? You fall. Be sure to tell your health care provider about all falls, even ones that seem minor. ? You feel dizzy, sleepy, or off-balance.  Take over-the-counter and prescription medicines only as told by your health care provider. These include any supplements.  Eat a healthy diet and maintain a healthy weight. A healthy diet includes low-fat dairy products, low-fat (lean) meats, and fiber from whole grains, beans, and lots of fruits and vegetables. Home safety  Remove any tripping hazards, such as rugs, cords, and clutter.  Install safety equipment such as grab bars in bathrooms and safety  rails on stairs.  Keep rooms and walkways well-lit. Activity   Follow a regular exercise program to stay fit. This will help you maintain your balance. Ask your health care provider what types of exercise are appropriate for you.  If you need a cane or walker, use it as recommended by your health care provider.  Wear supportive shoes that have nonskid soles. Lifestyle  Do not drink alcohol if your health care provider tells you not to  drink.  If you drink alcohol, limit how much you have: ? 0-1 drink a day for women. ? 0-2 drinks a day for men.  Be aware of how much alcohol is in your drink. In the [place] one drink equals one typical bottle of beer (12 oz), one-half glass of wine (5 oz), or one shot of hard liquor (1 oz).  Do not use any products that contain nicotine or tobacco, such as cigarettes and e-cigarettes. If you need help quitting, ask your health care provider. Summary  Having a healthy lifestyle and getting preventive care can help to protect your health and wellness after age 45.  Screening and testing are the best way to find a health problem early and help you avoid having a fall. Early diagnosis and treatment give you the best chance for managing medical conditions that are more common for people who are older than age 57.  Falls are a major cause of broken bones and head injuries in people who are older than age 71. Take precautions to prevent a fall at home.  Work with your health care provider to learn what changes you can make to improve your health and wellness and to prevent falls. This information is not intended to replace advice given to you by your health care provider. Make sure you discuss any questions you have with your health care provider. Document Revised: 05/22/2018 Document Reviewed: 12/12/2016 Elsevier Patient Education  2020 [employer].

## 2019-04-13 ENCOUNTER — Other Ambulatory Visit: Payer: Self-pay

## 2019-04-13 ENCOUNTER — Ambulatory Visit (INDEPENDENT_AMBULATORY_CARE_PROVIDER_SITE_OTHER): Payer: 59 | Admitting: Family Medicine

## 2019-04-13 ENCOUNTER — Encounter: Payer: Self-pay | Admitting: Family Medicine

## 2019-04-13 VITALS — BP 148/80 | HR 86 | Temp 98.4°F | Resp 16 | Ht 62.0 in | Wt 150.0 lb

## 2019-04-13 DIAGNOSIS — Z8639 Personal history of other endocrine, nutritional and metabolic disease: Secondary | ICD-10-CM | POA: Diagnosis not present

## 2019-04-13 DIAGNOSIS — M810 Age-related osteoporosis without current pathological fracture: Secondary | ICD-10-CM

## 2019-04-13 DIAGNOSIS — R7303 Prediabetes: Secondary | ICD-10-CM | POA: Diagnosis not present

## 2019-04-13 DIAGNOSIS — E785 Hyperlipidemia, unspecified: Secondary | ICD-10-CM

## 2019-04-13 DIAGNOSIS — Z122 Encounter for screening for malignant neoplasm of respiratory organs: Secondary | ICD-10-CM

## 2019-04-13 DIAGNOSIS — D649 Anemia, unspecified: Secondary | ICD-10-CM

## 2019-04-13 DIAGNOSIS — Z0000 Encounter for general adult medical examination without abnormal findings: Secondary | ICD-10-CM

## 2019-04-13 DIAGNOSIS — Z1329 Encounter for screening for other suspected endocrine disorder: Secondary | ICD-10-CM

## 2019-04-13 DIAGNOSIS — K219 Gastro-esophageal reflux disease without esophagitis: Secondary | ICD-10-CM

## 2019-04-13 DIAGNOSIS — I10 Essential (primary) hypertension: Secondary | ICD-10-CM | POA: Diagnosis not present

## 2019-04-13 DIAGNOSIS — Z720 Tobacco use: Secondary | ICD-10-CM

## 2019-04-13 LAB — LIPID PANEL
Cholesterol: 101 mg/dL (ref 0–200)
HDL: 45.3 mg/dL (ref >39.00)
LDL Cholesterol: 43 mg/dL (ref 0–99)
NonHDL: 55.97
Total CHOL/HDL Ratio: 2
Triglycerides: 67 mg/dL (ref 0.0–149.0)
VLDL: 13.4 mg/dL (ref 0.0–40.0)

## 2019-04-13 LAB — COMPREHENSIVE METABOLIC PANEL
ALT: 19 U/L (ref 0–35)
AST: 20 U/L (ref 0–37)
Albumin: 3.8 g/dL (ref 3.5–5.2)
Alkaline Phosphatase: 92 U/L (ref 39–117)
BUN: 9 mg/dL (ref 6–23)
CO2: 29 mEq/L (ref 19–32)
Calcium: 9.1 mg/dL (ref 8.4–10.5)
Chloride: 103 mEq/L (ref 96–112)
Creatinine, Ser: 0.82 mg/dL (ref 0.40–1.20)
GFR: 84.32 mL/min (ref >60.00)
Glucose, Bld: 110 mg/dL — ABNORMAL HIGH (ref 70–99)
Potassium: 3.6 mEq/L (ref 3.5–5.1)
Sodium: 141 mEq/L (ref 135–145)
Total Bilirubin: 0.2 mg/dL (ref 0.2–1.2)
Total Protein: 6.7 g/dL (ref 6.0–8.3)

## 2019-04-13 LAB — CBC
HCT: 34.4 % — ABNORMAL LOW (ref 36.0–46.0)
Hemoglobin: 11.7 g/dL — ABNORMAL LOW (ref 12.0–15.0)
MCHC: 34.1 g/dL (ref 30.0–36.0)
MCV: 90.4 fl (ref 78.0–100.0)
Platelets: 307 10*3/uL (ref 150.0–400.0)
RBC: 3.81 Mil/uL — ABNORMAL LOW (ref 3.87–5.11)
RDW: 12.9 % (ref 11.5–15.5)
WBC: 6.8 10*3/uL (ref 4.0–10.5)

## 2019-04-13 LAB — HEMOGLOBIN A1C: Hgb A1c MFr Bld: 6.1 % (ref 4.6–6.5)

## 2019-04-13 LAB — TSH: TSH: 0.37 u[IU]/mL (ref 0.35–4.50)

## 2019-04-13 LAB — VITAMIN D 25 HYDROXY (VIT D DEFICIENCY, FRACTURES): VITD: 38.7 ng/mL (ref 30.00–100.00)

## 2019-04-13 MED ORDER — CHANTIX STARTING MONTH PAK 0.5 MG X 11 & 1 MG X 42 PO TABS: ORAL_TABLET | ORAL | 0 refills | Status: DC

## 2019-04-13 MED ORDER — ATORVASTATIN CALCIUM 10 MG PO TABS: 10.0000 mg | ORAL_TABLET | Freq: Every day | ORAL | 3 refills | Status: DC

## 2019-04-13 MED ORDER — AMLODIPINE BESYLATE 10 MG PO TABS: 10.0000 mg | ORAL_TABLET | Freq: Every day | ORAL | 3 refills | Status: DC

## 2019-04-13 NOTE — Addendum Note (Signed)
Addended by: Abbe Amsterdam C on: 04/13/2019 04:02 PM   Modules accepted: Orders

## 2019-04-27 ENCOUNTER — Other Ambulatory Visit: Payer: Self-pay

## 2019-04-27 ENCOUNTER — Ambulatory Visit (HOSPITAL_BASED_OUTPATIENT_CLINIC_OR_DEPARTMENT_OTHER)
Admission: RE | Admit: 2019-04-27 | Discharge: 2019-04-27 | Disposition: A | Discharge status: Home / Self Care | Payer: 59 | Source: Ambulatory Visit | Attending: Family Medicine | Admitting: Family Medicine

## 2019-04-27 ENCOUNTER — Encounter: Payer: Self-pay | Admitting: Family Medicine

## 2019-04-27 DIAGNOSIS — Z720 Tobacco use: Secondary | ICD-10-CM

## 2019-04-27 DIAGNOSIS — M810 Age-related osteoporosis without current pathological fracture: Secondary | ICD-10-CM | POA: Insufficient documentation

## 2019-04-27 DIAGNOSIS — Z122 Encounter for screening for malignant neoplasm of respiratory organs: Secondary | ICD-10-CM

## 2019-05-17 ENCOUNTER — Other Ambulatory Visit: Payer: Self-pay | Admitting: Family Medicine

## 2019-05-17 DIAGNOSIS — Z720 Tobacco use: Secondary | ICD-10-CM

## 2020-04-10 NOTE — Patient Instructions (Addendum)
It was very nice to see you today, take care and I will be in touch with your labs as soon as possible We will set of your lung cancer screening CT We can repeat your bone density scan next year.  If you are not making progress we can consider changing you to Prolia We also ordered your mammogram today- please do this and your covid booster asap    Health Maintenance After Age 68 After age 33, you are at a higher risk for certain long-term diseases and infections as well as injuries from falls. Falls are a major cause of broken bones and head injuries in people who are older than age 22. Getting regular preventive care can help to keep you healthy and well. Preventive care includes getting regular testing and making lifestyle changes as recommended by your health care provider. Talk with your health care provider about:  Which screenings and tests you should have. A screening is a test that checks for a disease when you have no symptoms.  A diet and exercise plan that is right for you. What should I know about screenings and tests to prevent falls? Screening and testing are the best ways to find a health problem early. Early diagnosis and treatment give you the best chance of managing medical conditions that are common after age 82. Certain conditions and lifestyle choices may make you more likely to have a fall. Your health care provider may recommend:  Regular vision checks. Poor vision and conditions such as cataracts can make you more likely to have a fall. If you wear glasses, make sure to get your prescription updated if your vision changes.  Medicine review. Work with your health care provider to regularly review all of the medicines you are taking, including over-the-counter medicines. Ask your health care provider about any side effects that may make you more likely to have a fall. Tell your health care provider if any medicines that you take make you feel dizzy or sleepy.  Osteoporosis  screening. Osteoporosis is a condition that causes the bones to get weaker. This can make the bones weak and cause them to break more easily.  Blood pressure screening. Blood pressure changes and medicines to control blood pressure can make you feel dizzy.  Strength and balance checks. Your health care provider may recommend certain tests to check your strength and balance while standing, walking, or changing positions.  Foot health exam. Foot pain and numbness, as well as not wearing proper footwear, can make you more likely to have a fall.  Depression screening. You may be more likely to have a fall if you have a fear of falling, feel emotionally low, or feel unable to do activities that you used to do.  Alcohol use screening. Using too much alcohol can affect your balance and may make you more likely to have a fall. What actions can I take to lower my risk of falls? General instructions  Talk with your health care provider about your risks for falling. Tell your health care provider if: ? You fall. Be sure to tell your health care provider about all falls, even ones that seem minor. ? You feel dizzy, sleepy, or off-balance.  Take over-the-counter and prescription medicines only as told by your health care provider. These include any supplements.  Eat a healthy diet and maintain a healthy weight. A healthy diet includes low-fat dairy products, low-fat (lean) meats, and fiber from whole grains, beans, and lots of fruits and vegetables.  Home safety  Remove any tripping hazards, such as rugs, cords, and clutter.  Install safety equipment such as grab bars in bathrooms and safety rails on stairs.  Keep rooms and walkways well-lit. Activity  Follow a regular exercise program to stay fit. This will help you maintain your balance. Ask your health care provider what types of exercise are appropriate for you.  If you need a cane or walker, use it as recommended by your health care  provider.  Wear supportive shoes that have nonskid soles.   Lifestyle  Do not drink alcohol if your health care provider tells you not to drink.  If you drink alcohol, limit how much you have: ? 0-1 drink a day for women. ? 0-2 drinks a day for men.  Be aware of how much alcohol is in your drink. In the [place] one drink equals one typical bottle of beer (12 oz), one-half glass of wine (5 oz), or one shot of hard liquor (1 oz).  Do not use any products that contain nicotine or tobacco, such as cigarettes and e-cigarettes. If you need help quitting, ask your health care provider. Summary  Having a healthy lifestyle and getting preventive care can help to protect your health and wellness after age 50.  Screening and testing are the best way to find a health problem early and help you avoid having a fall. Early diagnosis and treatment give you the best chance for managing medical conditions that are more common for people who are older than age 76.  Falls are a major cause of broken bones and head injuries in people who are older than age 27. Take precautions to prevent a fall at home.  Work with your health care provider to learn what changes you can make to improve your health and wellness and to prevent falls. This information is not intended to replace advice given to you by your health care provider. Make sure you discuss any questions you have with your health care provider. Document Revised: 05/22/2018 Document Reviewed: 12/12/2016 Elsevier Patient Education  2021 [employer].

## 2020-04-10 NOTE — Progress Notes (Addendum)
[facility] [address] [place], [place] [address] [phone] [id number]  Date:  04/13/2020   Name:  Stephanie Phillips   DOB:  [birth year]   MRN:  [id number]  PCP:  Pearline Cables, MD    Chief Complaint: Annual Exam   History of Present Illness:  Stephanie Phillips is a 70 y.o. very pleasant female patient who presents with the following:  Patient here today for physical exam- history of hypertension, GERD, prediabetes, hyperlipidemia, osteoporosis, vitamin D deficiency Last seen by myself about 1 year ago She still works full-time for [employer]  Greater than 40 pack years tobacco-qualifies for lung cancer screening She is trying to quit and has cut down a good amount on her smoking  Labs 1 year ago Order lung cancer screening CT Colon cancer screen up-to-date Mammo is due DEXA 1 year ago-osteopenia Flu vaccine COVID-19 booster Shingrix, pneumonia complete Pap 2019- she does not want to update today   Fosamax Amlodipine Lipitor Vitamin D  She has also been seen by vascular surgery with [facility] 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  Her leg has healed well Patient Active Problem List   Diagnosis Date Noted  . Hyperlipidemia 06/24/2018  . Pre-diabetes 03/15/2016  . Vitamin D deficiency 03/15/2016  . Gastroesophageal reflux disease 03/15/2016  . Osteoporosis 06/16/2015  . Benign hypertension 04/30/2013  . Current tobacco use 11/04/2012    Past Medical History:  Diagnosis Date  . Arthritis   . Gastroesophageal reflux disease 03/15/2016  . Hypertension   . Osteoporosis 06/16/2015    Past Surgical History:  Procedure Laterality  Date  . TONSILLECTOMY      Social History   Tobacco Use  . Smoking status: Current Every Day Smoker    Packs/day: 1.00    Years: 43.00    Pack years: 43.00    Types: Cigarettes  . Smokeless tobacco: Never Used  Substance Use Topics  . Alcohol use: Yes    Alcohol/week: 0.0 standard drinks  . Drug use: No    Family History  Problem Relation Age of Onset  . Hypertension Mother   . Hypertension Father     No Known Allergies  Medication list has been reviewed and updated.  Current Outpatient Medications on File Prior to Visit  Medication Sig Dispense Refill  . apixaban (ELIQUIS) 5 MG TABS tablet Take 5 mg by mouth in the morning and at bedtime.     No current facility-administered medications on file prior to visit.    Review of Systems:  As per HPI- otherwise negative.  BP Readings from Last 3 Encounters:  04/13/20 (!) 152/92  04/13/19 (!) 148/80  10/06/18 120/78    Physical Examination: Vitals:   04/13/20 0833  BP: (!) 152/92  Pulse: 65  Resp: 17  Temp: 98.1 F (36.7 C)  SpO2: 98%   Vitals:   04/13/20 0833  Weight: 153 lb (69.4 kg)  Height: 5\' 2"  (1.575 m)   Body mass index is 27.98 kg/m. Ideal Body Weight: Weight in (lb) to have BMI = 25: 136.4  GEN: no acute distress.  Minimal overweight, looks well HEENT: Atraumatic, Normocephalic.  Ears and Nose: No external deformity.  CV: RRR, No M/G/R. No JVD. No thrill. No extra heart sounds. PULM: CTA B, no wheezes, crackles, rhonchi. No retractions. No resp. distress. No accessory muscle use. ABD: S, NT, ND, +BS. No rebound. No HSM. EXTR: No c/c/e PSYCH: Normally interactive. Conversant.  Wearing compression socks.  Well-healed surgical site on posterior right  Assessment and Plan: Physical exam  History of vitamin D deficiency - Plan: VITAMIN D 25 Hydroxy (Vit-D Deficiency, Fractures)  Essential hypertension - Plan: CBC, Comprehensive metabolic panel, amLODipine (NORVASC) 10 MG  tablet  Dyslipidemia - Plan: Lipid panel, atorvastatin (LIPITOR) 10 MG tablet  Pre-diabetes - Plan: Hemoglobin A1c  Age-related osteoporosis without current pathological fracture - Plan: alendronate (FOSAMAX) 70 MG tablet  Screening for thyroid disorder - Plan: TSH  Mild anemia - Plan: CBC  Tobacco abuse - Plan: CT CHEST LUNG CA SCREEN LOW DOSE W/O CM  Encounter for screening mammogram for malignant neoplasm of breast - Plan: MM 3D SCREEN BREAST BILATERAL  Here today for physical exam.  Labs are pending as above.  Refilled medications Noted her blood pressure is slightly high.  Patient notes she is in a rush today, she does not wish to change her medication at this point. Order lung cancer screening CT and mammogram Encouraged continued healthy diet and exercise routine Will plan further follow- up pending labs.  Leg this visit occurred during the SARS-CoV-2 public health emergency.  Safety protocols were in place, including screening questions prior to the visit, additional usage of staff PPE, and extensive cleaning of exam room while observing appropriate contact time as indicated for disinfecting solutions.    Signed Abbe Amsterdam, MD  Received her labs as below 3/3-  Colon 2015 Results for orders placed or performed in visit on 04/13/20  CBC  Result Value Ref Range   WBC 6.9 4.0 - 10.5 K/uL   RBC 3.86 (L) 3.87 - 5.11 Mil/uL   Platelets 315.0 150.0 - 400.0 K/uL   Hemoglobin 11.8 (L) 12.0 - 15.0 g/dL   HCT 25.3 (L) 66.4 - 40.3 %   MCV 90.5 78.0 - 100.0 fl   MCHC 33.8 30.0 - 36.0 g/dL   RDW 47.4 25.9 - 56.3 %  Comprehensive metabolic panel  Result Value Ref Range   Sodium 140 135 - 145 mEq/L   Potassium 3.6 3.5 - 5.1 mEq/L   Chloride 106 96 - 112 mEq/L   CO2 29 19 - 32 mEq/L   Glucose, Bld 96 70 - 99 mg/dL   BUN 14 6 - 23 mg/dL   Creatinine, Ser 8.75 0.40 - 1.20 mg/dL   Total Bilirubin 0.2 0.2 - 1.2 mg/dL   Alkaline Phosphatase 108 39 - 117 U/L   AST 20 0 - 37  U/L   ALT 21 0 - 35 U/L   Total Protein 6.8 6.0 - 8.3 g/dL   Albumin 4.1 3.5 - 5.2 g/dL   GFR 64.33 >29.51 mL/min   Calcium 9.7 8.4 - 10.5 mg/dL  Hemoglobin O8C  Result Value Ref Range   Hgb A1c MFr Bld 6.2 4.6 - 6.5 %  Lipid panel  Result Value Ref Range   Cholesterol 104 0 - 200 mg/dL   Triglycerides 16.6 0.0 - 149.0 mg/dL   HDL 06.30 >16.01 mg/dL   VLDL 09.3 0.0 - 23.5 mg/dL   LDL Cholesterol 39 0 - 99 mg/dL   Total CHOL/HDL Ratio 2    NonHDL 55.34   TSH  Result Value Ref Range   TSH 1.37 0.35 -  4.50 uIU/mL  VITAMIN D 25 Hydroxy (Vit-D Deficiency, Fractures)  Result Value Ref Range   VITD 23.81 (L) 30.00 - 100.00 ng/mL

## 2020-04-13 ENCOUNTER — Other Ambulatory Visit: Payer: Self-pay | Admitting: Family Medicine

## 2020-04-13 ENCOUNTER — Ambulatory Visit (INDEPENDENT_AMBULATORY_CARE_PROVIDER_SITE_OTHER): Payer: 59 | Admitting: Family Medicine

## 2020-04-13 ENCOUNTER — Encounter: Payer: Self-pay | Admitting: Family Medicine

## 2020-04-13 ENCOUNTER — Other Ambulatory Visit: Payer: Self-pay

## 2020-04-13 VITALS — BP 150/80 | HR 65 | Temp 98.1°F | Resp 17 | Ht 62.0 in | Wt 153.0 lb

## 2020-04-13 DIAGNOSIS — D649 Anemia, unspecified: Secondary | ICD-10-CM

## 2020-04-13 DIAGNOSIS — M810 Age-related osteoporosis without current pathological fracture: Secondary | ICD-10-CM

## 2020-04-13 DIAGNOSIS — Z1329 Encounter for screening for other suspected endocrine disorder: Secondary | ICD-10-CM

## 2020-04-13 DIAGNOSIS — Z8639 Personal history of other endocrine, nutritional and metabolic disease: Secondary | ICD-10-CM

## 2020-04-13 DIAGNOSIS — Z720 Tobacco use: Secondary | ICD-10-CM

## 2020-04-13 DIAGNOSIS — Z1231 Encounter for screening mammogram for malignant neoplasm of breast: Secondary | ICD-10-CM

## 2020-04-13 DIAGNOSIS — Z0000 Encounter for general adult medical examination without abnormal findings: Secondary | ICD-10-CM

## 2020-04-13 DIAGNOSIS — R7303 Prediabetes: Secondary | ICD-10-CM | POA: Diagnosis not present

## 2020-04-13 DIAGNOSIS — I10 Essential (primary) hypertension: Secondary | ICD-10-CM

## 2020-04-13 DIAGNOSIS — E785 Hyperlipidemia, unspecified: Secondary | ICD-10-CM | POA: Diagnosis not present

## 2020-04-13 LAB — COMPREHENSIVE METABOLIC PANEL
ALT: 21 U/L (ref 0–35)
AST: 20 U/L (ref 0–37)
Albumin: 4.1 g/dL (ref 3.5–5.2)
Alkaline Phosphatase: 108 U/L (ref 39–117)
BUN: 14 mg/dL (ref 6–23)
CO2: 29 mEq/L (ref 19–32)
Calcium: 9.7 mg/dL (ref 8.4–10.5)
Chloride: 106 mEq/L (ref 96–112)
Creatinine, Ser: 0.74 mg/dL (ref 0.40–1.20)
GFR: 83.8 mL/min (ref >60.00)
Glucose, Bld: 96 mg/dL (ref 70–99)
Potassium: 3.6 mEq/L (ref 3.5–5.1)
Sodium: 140 mEq/L (ref 135–145)
Total Bilirubin: 0.2 mg/dL (ref 0.2–1.2)
Total Protein: 6.8 g/dL (ref 6.0–8.3)

## 2020-04-13 LAB — HEMOGLOBIN A1C: Hgb A1c MFr Bld: 6.2 % (ref 4.6–6.5)

## 2020-04-13 LAB — LIPID PANEL
Cholesterol: 104 mg/dL (ref 0–200)
HDL: 48.7 mg/dL (ref >39.00)
LDL Cholesterol: 39 mg/dL (ref 0–99)
NonHDL: 55.34
Total CHOL/HDL Ratio: 2
Triglycerides: 80 mg/dL (ref 0.0–149.0)
VLDL: 16 mg/dL (ref 0.0–40.0)

## 2020-04-13 LAB — CBC
HCT: 34.9 % — ABNORMAL LOW (ref 36.0–46.0)
Hemoglobin: 11.8 g/dL — ABNORMAL LOW (ref 12.0–15.0)
MCHC: 33.8 g/dL (ref 30.0–36.0)
MCV: 90.5 fl (ref 78.0–100.0)
Platelets: 315 10*3/uL (ref 150.0–400.0)
RBC: 3.86 Mil/uL — ABNORMAL LOW (ref 3.87–5.11)
RDW: 13.1 % (ref 11.5–15.5)
WBC: 6.9 10*3/uL (ref 4.0–10.5)

## 2020-04-13 MED ORDER — ATORVASTATIN CALCIUM 10 MG PO TABS: 10.0000 mg | ORAL_TABLET | Freq: Every day | ORAL | 3 refills | Status: DC

## 2020-04-13 MED ORDER — AMLODIPINE BESYLATE 10 MG PO TABS: 10.0000 mg | ORAL_TABLET | Freq: Every day | ORAL | 3 refills | Status: DC

## 2020-04-13 MED ORDER — ALENDRONATE SODIUM 70 MG PO TABS: ORAL_TABLET | ORAL | 3 refills | Status: DC

## 2020-04-14 LAB — TSH: TSH: 1.37 u[IU]/mL (ref 0.35–4.50)

## 2020-04-14 LAB — VITAMIN D 25 HYDROXY (VIT D DEFICIENCY, FRACTURES): VITD: 23.81 ng/mL — ABNORMAL LOW (ref 30.00–100.00)

## 2020-04-14 NOTE — Addendum Note (Signed)
Addended by: Abbe Amsterdam C on: 04/14/2020 07:54 PM   Modules accepted: Orders

## 2020-04-15 ENCOUNTER — Telehealth: Payer: Self-pay

## 2020-04-15 NOTE — Telephone Encounter (Signed)
-----   Message from Pearline Cables, MD sent at 04/14/2020  7:38 PM EST ----- Please send her a stool card- can send with letter that I sent her Thank you!

## 2020-04-15 NOTE — Telephone Encounter (Signed)
Kit mailed to patient.  

## 2020-04-23 ENCOUNTER — Ambulatory Visit (HOSPITAL_BASED_OUTPATIENT_CLINIC_OR_DEPARTMENT_OTHER)
Admission: RE | Admit: 2020-04-23 | Discharge: 2020-04-23 | Disposition: A | Discharge status: Home / Self Care | Payer: 59 | Source: Ambulatory Visit | Attending: Family Medicine | Admitting: Family Medicine

## 2020-04-23 ENCOUNTER — Other Ambulatory Visit: Payer: Self-pay

## 2020-04-23 DIAGNOSIS — Z1231 Encounter for screening mammogram for malignant neoplasm of breast: Secondary | ICD-10-CM | POA: Diagnosis not present

## 2020-05-03 ENCOUNTER — Ambulatory Visit (HOSPITAL_BASED_OUTPATIENT_CLINIC_OR_DEPARTMENT_OTHER)
Admission: RE | Admit: 2020-05-03 | Discharge: 2020-05-03 | Disposition: A | Discharge status: Home / Self Care | Payer: 59 | Source: Ambulatory Visit | Attending: Family Medicine | Admitting: Family Medicine

## 2020-05-03 ENCOUNTER — Other Ambulatory Visit: Payer: Self-pay

## 2020-05-03 DIAGNOSIS — Z720 Tobacco use: Secondary | ICD-10-CM | POA: Insufficient documentation

## 2020-05-04 ENCOUNTER — Encounter: Payer: Self-pay | Admitting: Family Medicine

## 2020-05-06 ENCOUNTER — Ambulatory Visit: Payer: 59 | Attending: Internal Medicine

## 2020-05-06 DIAGNOSIS — Z23 Encounter for immunization: Secondary | ICD-10-CM

## 2020-05-06 NOTE — Progress Notes (Signed)
° °  Covid-19 Vaccination Clinic  Name:  Stephanie Phillips    MRN: [id number] DOB: 27-Jul-1950  05/06/2020  [name] was observed post Covid-19 immunization for 15 minutes without incident. She was provided with Vaccine Information Sheet and instruction to access the V-Safe system.   [name] was instructed to call 911 with any severe reactions post vaccine:  Difficulty breathing   Swelling of face and throat   A fast heartbeat   A bad rash all over body   Dizziness and weakness   Immunizations Administered    Name Date Dose VIS Date Route   PFIZER Comrnaty(Gray TOP) Covid-19 Vaccine 05/06/2020  1:17 PM 0.3 mL 01/21/2020 Intramuscular   Manufacturer: [employer], [employer]   Lot: [id number]   NDC: [phone]

## 2020-05-10 ENCOUNTER — Other Ambulatory Visit (HOSPITAL_COMMUNITY): Payer: Self-pay | Admitting: Internal Medicine

## 2020-05-10 MED FILL — PFIZER-BIONT COVID-19 VAC-T: 30 | 21 days supply | Qty: 0 | Fill #0

## 2020-09-20 ENCOUNTER — Telehealth: Payer: Self-pay | Admitting: Family Medicine

## 2020-09-20 DIAGNOSIS — R238 Other skin changes: Secondary | ICD-10-CM

## 2020-09-20 MED ORDER — TRIAMCINOLONE ACETONIDE 0.1 % EX CREA
1.0000 | TOPICAL_CREAM | Freq: Two times a day (BID) | CUTANEOUS | 1 refills | Status: DC
Start: 2020-09-20 — End: 2022-01-24

## 2020-09-20 NOTE — Telephone Encounter (Signed)
Pt came in office stating if she can get a refill on Triamcinolone Acetonide 0.1% cream. Pt states provider is aware of her condition which she breaks out on skin, if possible to send refill to: [id number] - [place], [place] - [address]  [address] [place] [address]  Phone:  [phone]  Fax:  [phone]  DEA #:  [id number]. Pt tel is [phone].

## 2020-09-20 NOTE — Telephone Encounter (Signed)
Refill sent to pharmacy.   

## 2020-11-22 IMAGING — DX RIGHT WRIST - COMPLETE 3+ VIEW
4 series · 4 of 4 positions shown · non-contrast
Comparison: None.

CLINICAL DATA: Status post fall.

EXAM:
RIGHT WRIST - COMPLETE 3+ VIEW

[wrist pa]
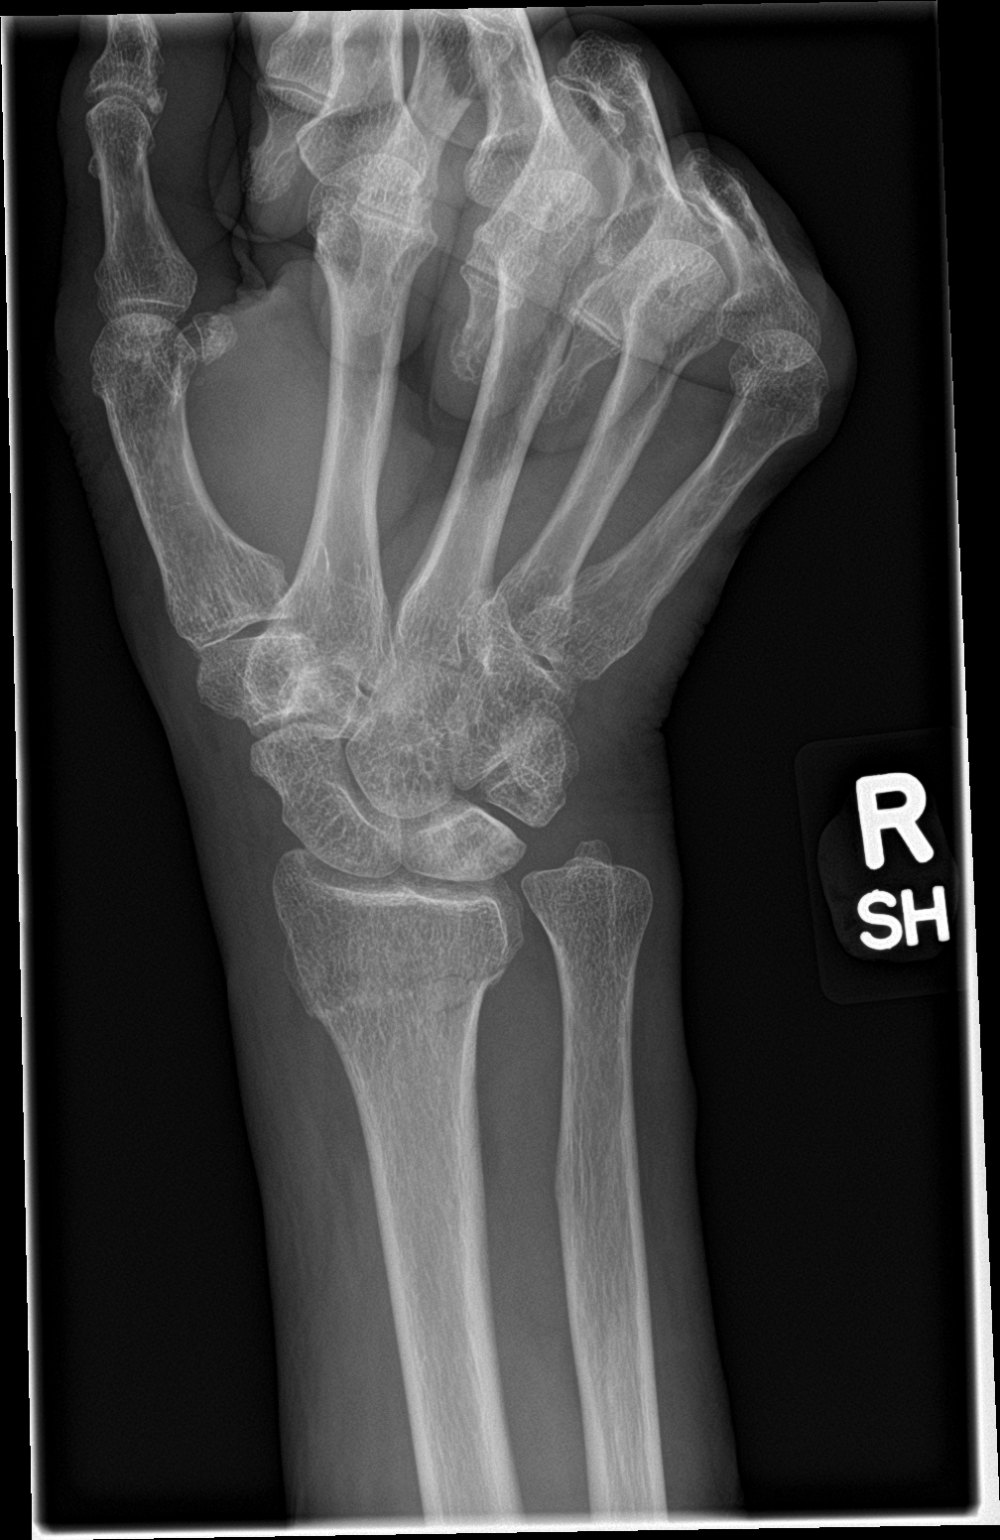

[wrist obl]
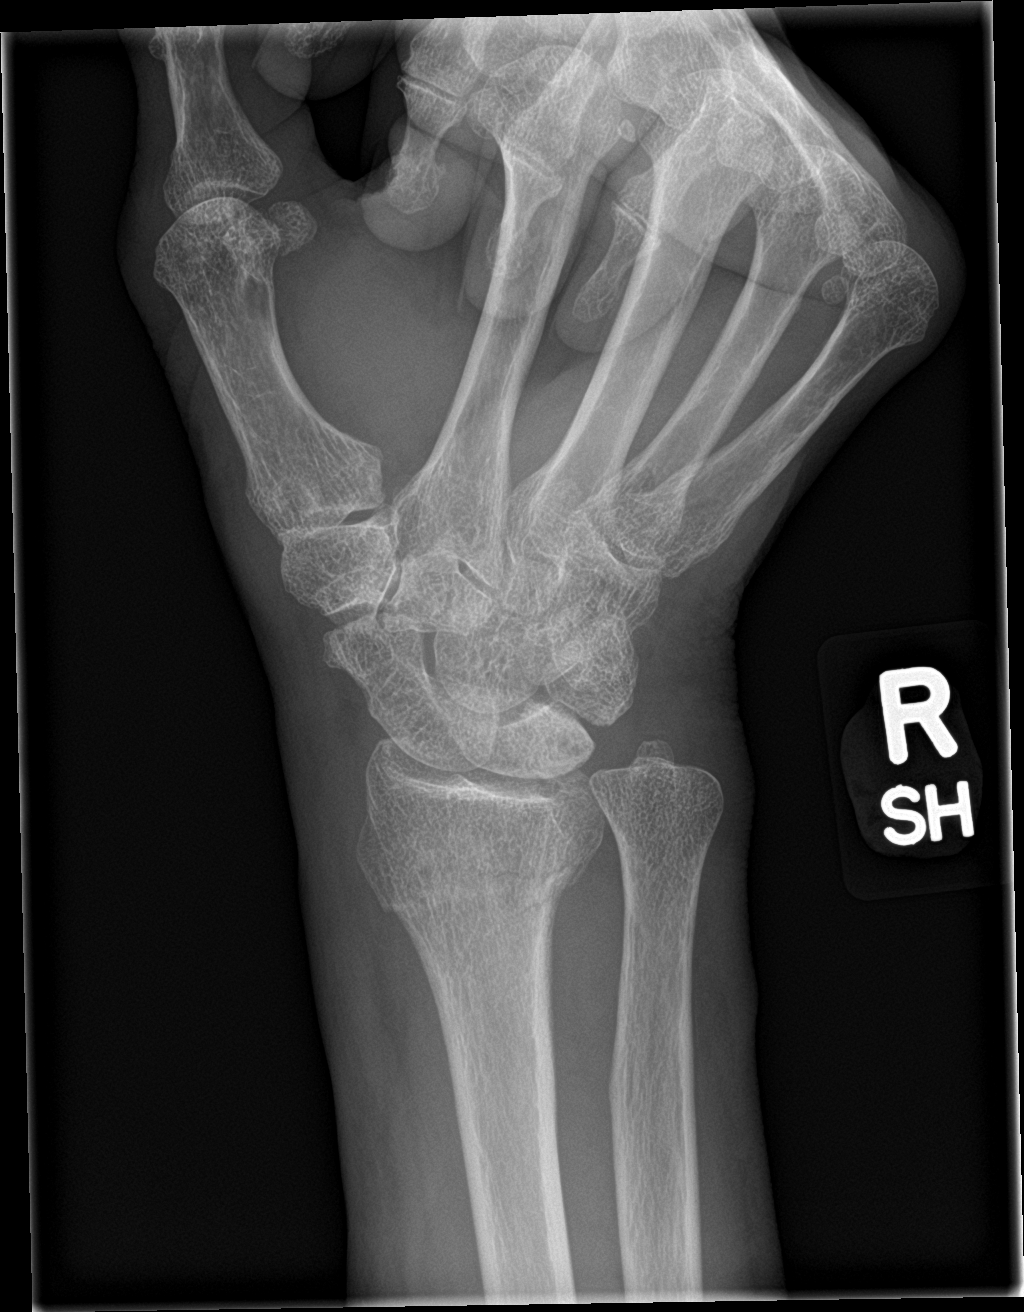

[wrist lat]
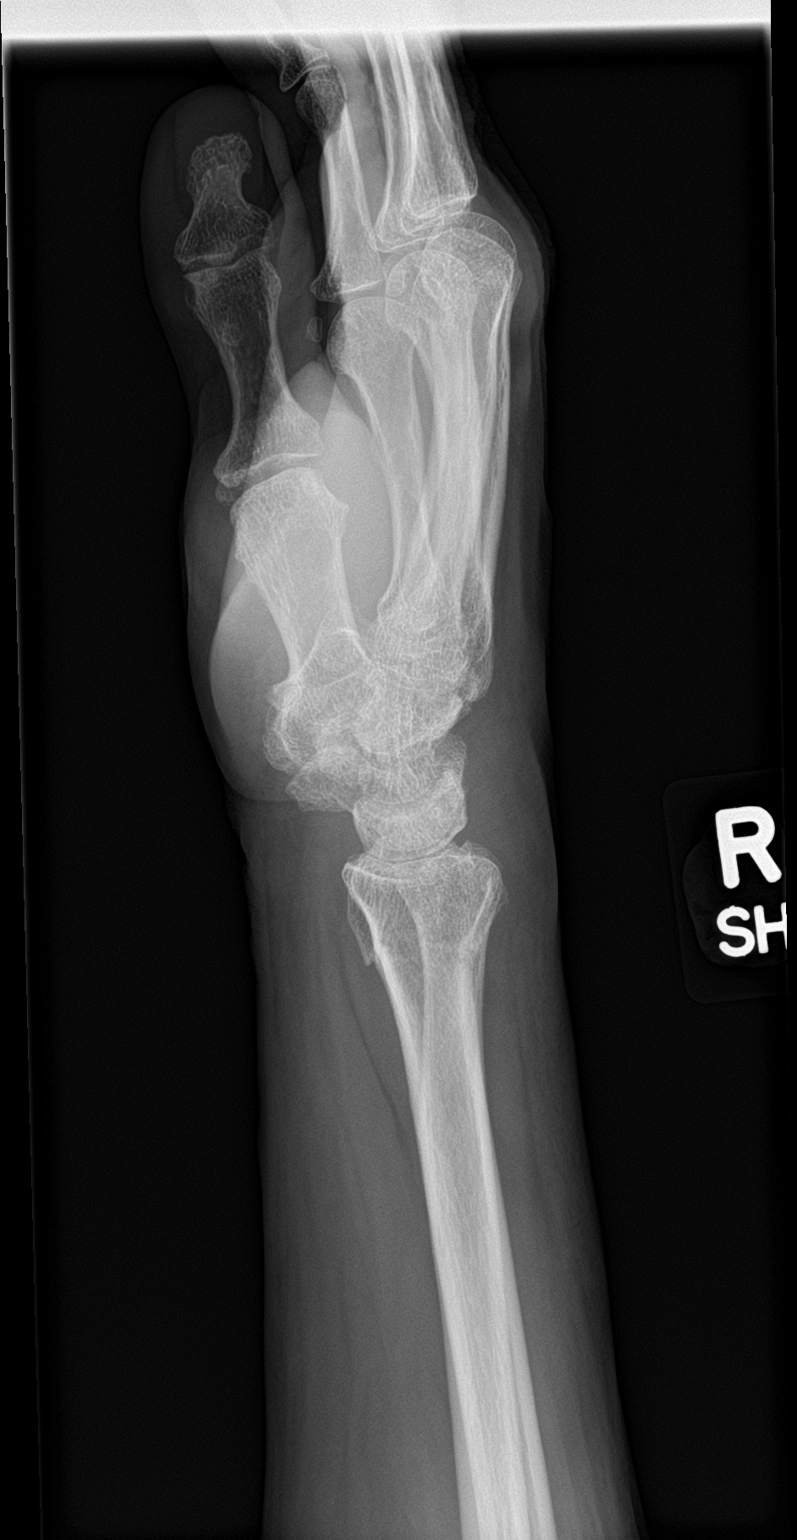

[wrist navicular]
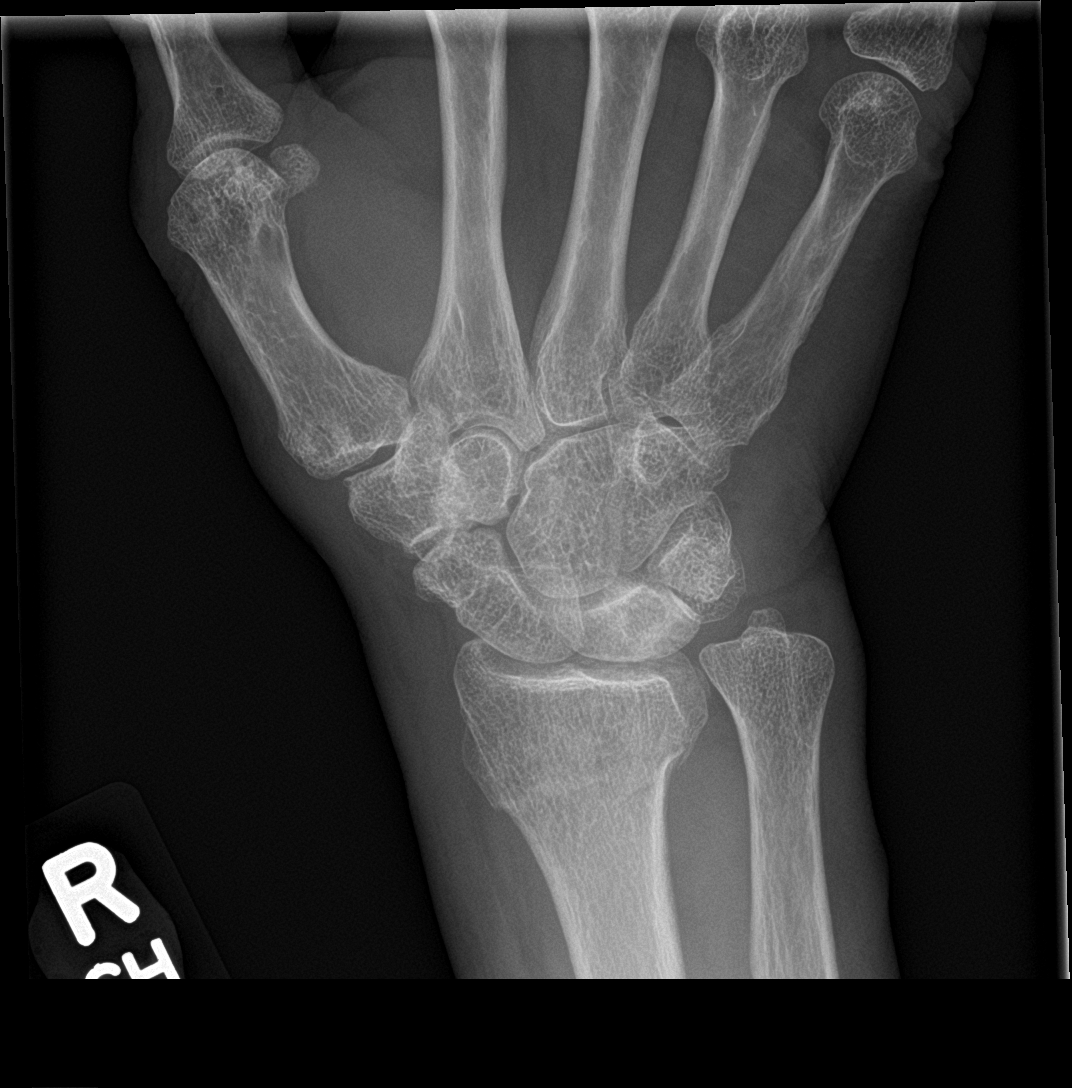

[4 of 4 positions shown; findings below may reference images not displayed]

FINDINGS: Nondisplaced ununited transverse fracture of the distal radial
metaphysis without significant angulation.

No other fracture or dislocation. No aggressive osseous lesion.
Generalized osteopenia. Mild osteoarthritis of the first CMC joint.
Soft tissue swelling along the dorsal aspect of the wrist.
IMPRESSION: Nondisplaced ununited transverse fracture of the distal radial
metaphysis without significant angulation.

## 2021-03-14 ENCOUNTER — Other Ambulatory Visit: Payer: Self-pay | Admitting: Family Medicine

## 2021-03-14 DIAGNOSIS — M810 Age-related osteoporosis without current pathological fracture: Secondary | ICD-10-CM

## 2021-04-11 NOTE — Patient Instructions (Addendum)
To see you again today, I will be in touch with your labs as soon as possible ? ?Please stop by imaging on the ground floor and schedule ?Mammogram ?Bone density ?Aorta aneurysm screening ? ? ? ?

## 2021-04-11 NOTE — Progress Notes (Deleted)
[facility] ?[address], [address] ?[place], [place] [address] ?[phone] ?Fax [phone] ? ?Date:  04/17/2021  ? ?Name:  Stephanie Phillips   DOB:  [birth year]   MRN:  [id number] ? ?PCP:  Pearline Cables, MD  ? ? ?Chief Complaint: No chief complaint on file. ? ? ?History of Present Illness: ? ?Stephanie Phillips is a 71 y.o. very pleasant female patient who presents with the following: ? ?Patient seen today for physical exam ?Most recent visit with myself was about 1 year ago ? ?history of hypertension, GERD, prediabetes, hyperlipidemia, osteoporosis, vitamin D deficiency ?Last seen by myself about 1 year ago ?She still works full-time for [employer] ?  ?Greater than 40 pack years tobacco-qualifies for lung cancer screening ?She is trying to quit and has cut down a good amount on her smoking ?  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  Plan for annual follow-up ? ?COVID booster ?Shingrix, pneumonia up-to-date ?Lung cancer screening CT-1 year ago, can order ?Colon, mammogram up-to-date-can order repeat mammogram ?Can update bone density ?Most recent Pap 2019 at age 46, normal ?Labs 1 year ago, can update today ?Can offer screening for abdominal aortic aneurysm ? ?Fosamax ?Amlodipine ?Lipitor ?Patient Active Problem List  ? Diagnosis Date Noted  ? Hyperlipidemia 06/24/2018  ? Pre-diabetes 03/15/2016  ? Vitamin D deficiency 03/15/2016  ? Gastroesophageal reflux disease 03/15/2016  ? Osteoporosis 06/16/2015  ? Benign hypertension 04/30/2013  ? Current tobacco use 11/04/2012  ? ? ?Past Medical History:  ?Diagnosis Date  ? Arthritis   ? Gastroesophageal reflux disease 03/15/2016  ? Hypertension   ? Osteoporosis 06/16/2015  ? ? ?Past Surgical History:   ?Procedure Laterality Date  ? TONSILLECTOMY    ? ? ?Social History  ? ?Tobacco Use  ? Smoking status: Every Day  ?  Packs/day: 1.00  ?  Years: 43.00  ?  Pack years: 43.00  ?  Types: Cigarettes  ? Smokeless tobacco: Never  ?Substance Use Topics  ? Alcohol use: Yes  ?  Alcohol/week: 0.0 standard drinks  ? Drug use: No  ? ? ?Family History  ?Problem Relation Age of Onset  ? Hypertension Mother   ? Hypertension Father   ? ? ?No Known Allergies ? ?Medication list has been reviewed and updated. ? ?Current Outpatient Medications on File Prior to Visit  ?Medication Sig Dispense Refill  ? alendronate (FOSAMAX) 70 MG tablet TAKE 1 TABLET BY MOUTH ONCE A WEEK. TAKE WITH A FULL GLASS OF WATER ON AN EMPTY STOMACH. 12 tablet 3  ? amLODipine (NORVASC) 10 MG tablet Take 1 tablet (10 mg total) by mouth daily. 90 tablet 3  ? atorvastatin (LIPITOR) 10 MG tablet Take 1 tablet (10 mg total) by mouth daily. 90 tablet 3  ? COVID-19 mRNA Vac-TriS, Pfizer, SUSP injection INJECT AS DIRECTED .3 mL 0  ? triamcinolone cream (KENALOG) 0.1 % Apply 1 application topically 2 (two) times daily. 30 g 1  ? ?No current facility-administered medications on file prior to visit.  ? ? ?Review of Systems: ? ?As per HPI- otherwise negative. ? ? ?Physical Examination: ?There were no vitals filed for this visit. ?There were no vitals filed for this visit. ?There is no height or weight on  file to calculate BMI. ?Ideal Body Weight:   ? ?GEN: no acute distress. ?HEENT: Atraumatic, Normocephalic.  ?Ears and Nose: No external deformity. ?CV: RRR, No M/G/R. No JVD. No thrill. No extra heart sounds. ?PULM: CTA B, no wheezes, crackles, rhonchi. No retractions. No resp. distress. No accessory muscle use. ?ABD: S, NT, ND, +BS. No rebound. No HSM. ?EXTR: No c/c/e ?PSYCH: Normally interactive. Conversant.  ? ? ?Assessment and Plan: ?*** ?Physical exam today.  Encouraged healthy diet and exercise routine ?Encouraged tobacco cessation ?Will plan further follow- up pending  labs. ? ?Signed ?Abbe Amsterdam, MD ? ?

## 2021-04-17 ENCOUNTER — Ambulatory Visit (INDEPENDENT_AMBULATORY_CARE_PROVIDER_SITE_OTHER): Payer: 59 | Admitting: Family Medicine

## 2021-04-17 ENCOUNTER — Encounter: Payer: Self-pay | Admitting: Family Medicine

## 2021-04-17 VITALS — BP 138/68 | HR 86 | Temp 98.5°F | Ht 62.0 in | Wt 152.0 lb

## 2021-04-17 DIAGNOSIS — Z8639 Personal history of other endocrine, nutritional and metabolic disease: Secondary | ICD-10-CM

## 2021-04-17 DIAGNOSIS — Z1329 Encounter for screening for other suspected endocrine disorder: Secondary | ICD-10-CM

## 2021-04-17 DIAGNOSIS — M810 Age-related osteoporosis without current pathological fracture: Secondary | ICD-10-CM

## 2021-04-17 DIAGNOSIS — E785 Hyperlipidemia, unspecified: Secondary | ICD-10-CM | POA: Diagnosis not present

## 2021-04-17 DIAGNOSIS — G8929 Other chronic pain: Secondary | ICD-10-CM

## 2021-04-17 DIAGNOSIS — E876 Hypokalemia: Secondary | ICD-10-CM

## 2021-04-17 DIAGNOSIS — Z0000 Encounter for general adult medical examination without abnormal findings: Secondary | ICD-10-CM

## 2021-04-17 DIAGNOSIS — E2839 Other primary ovarian failure: Secondary | ICD-10-CM

## 2021-04-17 DIAGNOSIS — I10 Essential (primary) hypertension: Secondary | ICD-10-CM | POA: Diagnosis not present

## 2021-04-17 DIAGNOSIS — R5383 Other fatigue: Secondary | ICD-10-CM

## 2021-04-17 DIAGNOSIS — Z720 Tobacco use: Secondary | ICD-10-CM

## 2021-04-17 DIAGNOSIS — R7303 Prediabetes: Secondary | ICD-10-CM | POA: Diagnosis not present

## 2021-04-17 DIAGNOSIS — M25562 Pain in left knee: Secondary | ICD-10-CM

## 2021-04-17 DIAGNOSIS — Z1231 Encounter for screening mammogram for malignant neoplasm of breast: Secondary | ICD-10-CM

## 2021-04-17 DIAGNOSIS — D649 Anemia, unspecified: Secondary | ICD-10-CM | POA: Diagnosis not present

## 2021-04-17 LAB — CBC
HCT: 35.3 % — ABNORMAL LOW (ref 36.0–46.0)
Hemoglobin: 12.1 g/dL (ref 12.0–15.0)
MCHC: 34.2 g/dL (ref 30.0–36.0)
MCV: 90.9 fl (ref 78.0–100.0)
Platelets: 335 10*3/uL (ref 150.0–400.0)
RBC: 3.89 Mil/uL (ref 3.87–5.11)
RDW: 13.1 % (ref 11.5–15.5)
WBC: 7.8 10*3/uL (ref 4.0–10.5)

## 2021-04-17 LAB — COMPREHENSIVE METABOLIC PANEL
ALT: 17 U/L (ref 0–35)
AST: 17 U/L (ref 0–37)
Albumin: 4 g/dL (ref 3.5–5.2)
Alkaline Phosphatase: 94 U/L (ref 39–117)
BUN: 10 mg/dL (ref 6–23)
CO2: 29 mEq/L (ref 19–32)
Calcium: 9.4 mg/dL (ref 8.4–10.5)
Chloride: 103 mEq/L (ref 96–112)
Creatinine, Ser: 0.92 mg/dL (ref 0.40–1.20)
GFR: 64.08 mL/min (ref >60.00)
Glucose, Bld: 122 mg/dL — ABNORMAL HIGH (ref 70–99)
Potassium: 3.2 mEq/L — ABNORMAL LOW (ref 3.5–5.1)
Sodium: 141 mEq/L (ref 135–145)
Total Bilirubin: 0.3 mg/dL (ref 0.2–1.2)
Total Protein: 6.7 g/dL (ref 6.0–8.3)

## 2021-04-17 LAB — LIPID PANEL
Cholesterol: 111 mg/dL (ref 0–200)
HDL: 46.6 mg/dL (ref >39.00)
LDL Cholesterol: 40 mg/dL (ref 0–99)
NonHDL: 64.35
Total CHOL/HDL Ratio: 2
Triglycerides: 124 mg/dL (ref 0.0–149.0)
VLDL: 24.8 mg/dL (ref 0.0–40.0)

## 2021-04-17 LAB — TSH: TSH: 0.98 u[IU]/mL (ref 0.35–5.50)

## 2021-04-17 LAB — HEMOGLOBIN A1C: Hgb A1c MFr Bld: 5.9 % (ref 4.6–6.5)

## 2021-04-17 LAB — VITAMIN D 25 HYDROXY (VIT D DEFICIENCY, FRACTURES): VITD: 34.55 ng/mL (ref 30.00–100.00)

## 2021-04-17 MED ORDER — AMLODIPINE BESYLATE 10 MG PO TABS: 10.0000 mg | ORAL_TABLET | Freq: Every day | ORAL | 3 refills | Status: DC

## 2021-04-17 MED ORDER — ATORVASTATIN CALCIUM 10 MG PO TABS: 10.0000 mg | ORAL_TABLET | Freq: Every day | ORAL | 3 refills | Status: DC

## 2021-04-17 MED ORDER — ALENDRONATE SODIUM 70 MG PO TABS: ORAL_TABLET | ORAL | 3 refills | Status: DC

## 2021-04-17 MED ORDER — POTASSIUM CHLORIDE CRYS ER 20 MEQ PO TBCR: 20.0000 meq | EXTENDED_RELEASE_TABLET | Freq: Every day | ORAL | 0 refills | Status: DC

## 2021-04-17 NOTE — Progress Notes (Addendum)
Nature conservation officer at [facility] [employer] ?[address], [address] ?[place], [place] [address] ?336 [phone] ?Fax [phone] ? ?Date:  04/17/2021  ? ?Name:  Stephanie Phillips   DOB:  Mar 07, 1950   MRN:  [id number] ? ?PCP:  Pearline Cables, MD  ? ? ?Chief Complaint: Annual Exam (Cpe- ate at 2am) ? ? ?History of Present Illness: ? ?Stephanie Phillips is a 71 y.o. very pleasant female patient who presents with the following: ? ?Patient seen today for physical exam ?Most recent visit with myself was about 1 year ago ? 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  Plan for annual follow-up ? ?She is fasting for labs today ?COVID booster ?Shingrix, pneumonia up-to-date ?Lung cancer screening CT-1 year ago, can order ?Colon, mammogram up-to-date-can order repeat mammogram ?Can update bone density ?Most recent Pap 2019 at age 51, normal ?Labs 1 year ago, can update today ?Can offer screening for abdominal aortic aneurysm ? ?Fosamax ?Amlodipine ?Lipitor ?Patient Active Problem List  ? Diagnosis Date Noted  ? Hyperlipidemia 06/24/2018  ? Pre-diabetes 03/15/2016  ? Vitamin D deficiency 03/15/2016  ? Gastroesophageal reflux disease 03/15/2016  ? Osteoporosis 06/16/2015  ? Benign hypertension 04/30/2013  ? Current tobacco use 11/04/2012  ? ? ?Past Medical History:  ?Diagnosis Date  ? Arthritis   ? Gastroesophageal reflux disease  03/15/2016  ? Hypertension   ? Osteoporosis 06/16/2015  ? ? ?Past Surgical History:  ?Procedure Laterality Date  ? TONSILLECTOMY    ? ? ?Social History  ? ?Tobacco Use  ? Smoking status: Every Day  ?  Packs/day: 1.00  ?  Years: 43.00  ?  Pack years: 43.00  ?  Types: Cigarettes  ? Smokeless tobacco: Never  ?Substance Use Topics  ? Alcohol use: Yes  ?  Alcohol/week: 0.0 standard drinks  ? Drug use: No  ? ? ?Family History  ?Problem Relation Age of Onset  ? Hypertension Mother   ? Hypertension Father   ? ? ?No Known Allergies ? ?Medication list has been reviewed and updated. ? ?Current Outpatient Medications on File Prior to Visit  ?Medication Sig Dispense Refill  ? triamcinolone cream (KENALOG) 0.1 % Apply 1 application topically 2 (two) times daily. 30 g 1  ? ?No current facility-administered medications on file prior to visit.  ? ? ?Review of Systems: ? ?As per HPI- otherwise negative. ? ? ?Physical Examination: ?Vitals:  ? 04/17/21 0835  ?BP: 138/68  ?Pulse: 86  ?Temp: 98.5 ?F (36.9 ?C)  ?SpO2: 95%  ? ?Vitals:  ? 04/17/21 0835  ?Weight: 152 lb (68.9 kg)  ?Height: 5\' 2"  (1.575 m)  ? ?Body mass index is 27.8 kg/m?. ?Ideal Body Weight: Weight in (lb) to have BMI = 25: 136.4 ? ?GEN: no acute distress.  Minimal overweight, looks well ?HEENT: Atraumatic, Normocephalic.  Bilateral TM wnl, oropharynx normal.  PEERL,EOMI.   ?Ears and Nose: No external deformity. ?  CV: RRR, No M/G/R. No JVD. No thrill. No extra heart sounds. ?PULM: CTA B, no wheezes, crackles, rhonchi. No retractions. No resp. distress. No accessory muscle use. ?ABD: S, NT, ND, +BS. No rebound. No HSM. ?EXTR: No c/c/e ?PSYCH: Normally interactive. Conversant.  ?Left knee displays crepitus.  No definite effusion is noted ? ?Assessment and Plan: ?Physical exam ? ?Dyslipidemia - Plan: Lipid panel, atorvastatin (LIPITOR) 10 MG tablet ? ?Pre-diabetes - Plan: Comprehensive metabolic panel, Hemoglobin A1c ? ?History of vitamin D deficiency - Plan: VITAMIN D 25 Hydroxy  (Vit-D Deficiency, Fractures) ? ?Essential hypertension - Plan: CBC, Comprehensive metabolic panel, amLODipine (NORVASC) 10 MG tablet ? ?Age-related osteoporosis without current pathological fracture - Plan: alendronate (FOSAMAX) 70 MG tablet ? ?Mild anemia - Plan: CBC ? ?Screening for thyroid disorder - Plan: VITAMIN D 25 Hydroxy (Vit-D Deficiency, Fractures) ? ?Tobacco abuse - Plan: CT CHEST LUNG CA SCREEN LOW DOSE W/O CM, US [facility] MEDICARE SCREENING ? ?Estrogen deficiency - Plan: DG Bone Density ? ?Encounter for screening mammogram for malignant neoplasm of breast - Plan: MM 3D SCREEN BREAST BILATERAL ? ?Fatigue, unspecified type - Plan: TSH ? ?Chronic pain of left knee - Plan: Ambulatory referral to Orthopedic Surgery ? ?Physical exam today.  Encouraged healthy diet and exercise routine ?Encouraged tobacco cessation ?Will plan further follow- up pending labs. ?Ordered mammogram, bone density, lung cancer screening CT, AAA screening ultrasound ?Referral to orthopedics for chronic left knee pain ? ?Signed ?Abbe Amsterdam, MD ? ?Received patient labs as below, message to patient ? ?Results for orders placed or performed in visit on 04/17/21  ?CBC  ?Result Value Ref Range  ? WBC 7.8 4.0 - 10.5 K/uL  ? RBC 3.89 3.87 - 5.11 Mil/uL  ? Platelets 335.0 150.0 - 400.0 K/uL  ? Hemoglobin 12.1 12.0 - 15.0 g/dL  ? HCT 35.3 (L) 36.0 - 46.0 %  ? MCV 90.9 78.0 - 100.0 fl  ? MCHC 34.2 30.0 - 36.0 g/dL  ? RDW 13.1 11.5 - 15.5 %  ?Comprehensive metabolic panel  ?Result Value Ref Range  ? Sodium 141 135 - 145 mEq/L  ? Potassium 3.2 (L) 3.5 - 5.1 mEq/L  ? Chloride 103 96 - 112 mEq/L  ? CO2 29 19 - 32 mEq/L  ? Glucose, Bld 122 (H) 70 - 99 mg/dL  ? BUN 10 6 - 23 mg/dL  ? Creatinine, Ser 0.92 0.40 - 1.20 mg/dL  ? Total Bilirubin 0.3 0.2 - 1.2 mg/dL  ? Alkaline Phosphatase 94 39 - 117 U/L  ? AST 17 0 - 37 U/L  ? ALT 17 0 - 35 U/L  ? Total Protein 6.7 6.0 - 8.3 g/dL  ? Albumin 4.0 3.5 - 5.2 g/dL  ? GFR 64.08 >60.00 mL/min  ? Calcium 9.4  8.4 - 10.5 mg/dL  ?Hemoglobin A1c  ?Result Value Ref Range  ? Hgb A1c MFr Bld 5.9 4.6 - 6.5 %  ?Lipid panel  ?Result Value Ref Range  ? Cholesterol 111 0 - 200 mg/dL  ? Triglycerides 124.0 0.0 - 149.0 mg/dL  ? HDL 46.60 >39.00 mg/dL  ? VLDL 24.8 0.0 - 40.0 mg/dL  ? LDL Cholesterol 40 0 - 99 mg/dL  ? Total CHOL/HDL Ratio 2   ? NonHDL 64.35   ?TSH  ?Result Value Ref Range  ? TSH 0.98 0.35 - 5.50 uIU/mL  ?VITAMIN D 25 Hydroxy (Vit-D Deficiency, Fractures)  ?Result Value Ref Range  ? VITD 34.55 30.00 - 100.00 ng/mL  ? ? ? ?

## 2021-04-17 NOTE — Addendum Note (Signed)
Addended by: Abbe Amsterdam C on: 04/17/2021 05:03 PM ? ? Modules accepted: Orders ? ?

## 2021-04-21 ENCOUNTER — Ambulatory Visit: Payer: 59 | Admitting: Orthopaedic Surgery

## 2021-04-21 ENCOUNTER — Ambulatory Visit (INDEPENDENT_AMBULATORY_CARE_PROVIDER_SITE_OTHER): Payer: 59

## 2021-04-21 ENCOUNTER — Other Ambulatory Visit: Payer: Self-pay

## 2021-04-21 ENCOUNTER — Encounter: Payer: Self-pay | Admitting: Orthopaedic Surgery

## 2021-04-21 DIAGNOSIS — G8929 Other chronic pain: Secondary | ICD-10-CM | POA: Diagnosis not present

## 2021-04-21 DIAGNOSIS — M25562 Pain in left knee: Secondary | ICD-10-CM

## 2021-04-21 DIAGNOSIS — M1712 Unilateral primary osteoarthritis, left knee: Secondary | ICD-10-CM | POA: Diagnosis not present

## 2021-04-21 MED ORDER — BUPIVACAINE HCL 0.5 % IJ SOLN
2.0000 mL | INTRAMUSCULAR | Status: AC | PRN
  Administered 2021-04-21: 2 mL via INTRA_ARTICULAR

## 2021-04-21 MED ORDER — LIDOCAINE HCL 1 % IJ SOLN
2.0000 mL | INTRAMUSCULAR | Status: AC | PRN
  Administered 2021-04-21: 2 mL

## 2021-04-21 MED ORDER — METHYLPREDNISOLONE ACETATE 40 MG/ML IJ SUSP
40.0000 mg | INTRAMUSCULAR | Status: AC | PRN
  Administered 2021-04-21: 40 mg via INTRA_ARTICULAR

## 2021-04-21 NOTE — Progress Notes (Signed)
? ?Office Visit Note ?  ?Patient: Stephanie Phillips           ?Date of Birth: [birth year]           ?MRN: [id number] ?Visit Date: 04/21/2021 ?             ?Requested by: Darreld Mclean, MD ?[address] ?[address] ?[place],  [place] [address] ?PCP: Darreld Mclean, MD ? ? ?Assessment & Plan: ?Visit Diagnoses:  ?1. Primary osteoarthritis of left knee   ? ? ?Plan: Impression is left knee osteoarthritis exacerbation.  Treatment options were reviewed and they are associated pros and cons and based on her options she elected to undergo cortisone injection today which she tolerated well.  She will also pick up some Voltaren gel to try.  Follow-up as needed. ? ?Follow-Up Instructions: No follow-ups on file.  ? ?Orders:  ?Orders Placed This Encounter  ?Procedures  ? XR KNEE 3 VIEW LEFT  ? ?No orders of the defined types were placed in this encounter. ? ? ? ? Procedures: ?Large Joint Inj: L knee on 04/21/2021 10:42 AM ?Details: 22 G needle ?Medications: 2 mL bupivacaine 0.5 %; 2 mL lidocaine 1 %; 40 mg methylPREDNISolone acetate 40 MG/ML ?Outcome: tolerated well, no immediate complications ?Patient was prepped and draped in the usual sterile fashion.  ? ? ? ? ?Clinical Data: ?No additional findings. ? ? ?Subjective: ?Chief Complaint  ?Patient presents with  ? Left Knee - Pain  ? ? ?HPI ? ?Stephanie Phillips is a very pleasant 71 year old female here for evaluation of chronic left knee pain for about 4 months.  Denies any injuries or trauma.  Denies any mechanical symptoms.  Pain is worse at night.  She has chronic aching and throbbing pain.  Tylenol helps only temporarily.  She has never had surgeries to the knee before. ? ?Review of Systems  ?Constitutional: Negative.   ?HENT: Negative.    ?Eyes: Negative.   ?Respiratory: Negative.    ?Cardiovascular: Negative.   ?Endocrine: Negative.   ?Musculoskeletal: Negative.   ?Neurological: Negative.   ?Hematological: Negative.   ?Psychiatric/Behavioral: Negative.    ?All other systems reviewed  and are negative. ? ? ?Objective: ?Vital Signs: There were no vitals taken for this visit. ? ?Physical Exam ?Vitals and nursing note reviewed.  ?Constitutional:   ?   Appearance: She is well-developed.  ?HENT:  ?   Head: Normocephalic and atraumatic.  ?Pulmonary:  ?   Effort: Pulmonary effort is normal.  ?Abdominal:  ?   Palpations: Abdomen is soft.  ?Musculoskeletal:  ?   Cervical back: Neck supple.  ?Skin: ?   General: Skin is warm.  ?   Capillary Refill: Capillary refill takes less than 2 seconds.  ?Neurological:  ?   Mental Status: She is alert and oriented to person, place, and time.  ?Psychiatric:     ?   Behavior: Behavior normal.     ?   Thought Content: Thought content normal.     ?   Judgment: Judgment normal.  ? ? ?Ortho Exam ? ?Examination left knee shows no joint effusion.  Mild patellofemoral crepitus throughout arc of motion.  Collaterals and cruciates are stable.  Well-preserved range of motion.  No joint line tenderness. ? ?Specialty Comments:  ?No specialty comments available. ? ?Imaging: ?XR KNEE 3 VIEW LEFT ? ?Result Date: 04/21/2021 ?Mild to moderate arthritis with medial compartment joint space narrowing and periarticular spurring.  ? ? ?PMFS History: ?Patient Active Problem List  ?  Diagnosis Date Noted  ? Primary osteoarthritis of left knee 04/21/2021  ? Hyperlipidemia 06/24/2018  ? Pre-diabetes 03/15/2016  ? Vitamin D deficiency 03/15/2016  ? Gastroesophageal reflux disease 03/15/2016  ? Osteoporosis 06/16/2015  ? Benign hypertension 04/30/2013  ? Current tobacco use 11/04/2012  ? ?Past Medical History:  ?Diagnosis Date  ? Arthritis   ? Gastroesophageal reflux disease 03/15/2016  ? Hypertension   ? Osteoporosis 06/16/2015  ?  ?Family History  ?Problem Relation Age of Onset  ? Hypertension Mother   ? Hypertension Father   ?  ?Past Surgical History:  ?Procedure Laterality Date  ? TONSILLECTOMY    ? ?Social History  ? ?Occupational History  ? Not on file  ?Tobacco Use  ? Smoking status: Every Day  ?   Packs/day: 1.00  ?  Years: 43.00  ?  Pack years: 43.00  ?  Types: Cigarettes  ? Smokeless tobacco: Never  ?Substance and Sexual Activity  ? Alcohol use: Yes  ?  Alcohol/week: 0.0 standard drinks  ? Drug use: No  ? Sexual activity: Not on file  ? ? ? ? ? ? ?

## 2021-04-25 ENCOUNTER — Other Ambulatory Visit: Payer: Self-pay

## 2021-04-25 ENCOUNTER — Encounter (HOSPITAL_BASED_OUTPATIENT_CLINIC_OR_DEPARTMENT_OTHER): Payer: Self-pay

## 2021-04-25 ENCOUNTER — Ambulatory Visit (HOSPITAL_BASED_OUTPATIENT_CLINIC_OR_DEPARTMENT_OTHER)
Admission: RE | Admit: 2021-04-25 | Discharge: 2021-04-25 | Disposition: A | Discharge status: Home / Self Care | Payer: 59 | Source: Ambulatory Visit | Attending: Family Medicine | Admitting: Family Medicine

## 2021-04-25 ENCOUNTER — Other Ambulatory Visit: Payer: Self-pay | Admitting: Family Medicine

## 2021-04-25 DIAGNOSIS — E2839 Other primary ovarian failure: Secondary | ICD-10-CM | POA: Diagnosis present

## 2021-04-25 DIAGNOSIS — Z720 Tobacco use: Secondary | ICD-10-CM

## 2021-04-25 DIAGNOSIS — Z1231 Encounter for screening mammogram for malignant neoplasm of breast: Secondary | ICD-10-CM | POA: Diagnosis present

## 2021-04-26 ENCOUNTER — Encounter: Payer: Self-pay | Admitting: Family Medicine

## 2021-04-27 ENCOUNTER — Other Ambulatory Visit: Payer: Self-pay

## 2021-04-27 ENCOUNTER — Ambulatory Visit (HOSPITAL_BASED_OUTPATIENT_CLINIC_OR_DEPARTMENT_OTHER)
Admission: RE | Admit: 2021-04-27 | Discharge: 2021-04-27 | Disposition: A | Discharge status: Home / Self Care | Payer: 59 | Source: Ambulatory Visit | Attending: Family Medicine | Admitting: Family Medicine

## 2021-04-27 ENCOUNTER — Encounter: Payer: Self-pay | Admitting: Family Medicine

## 2021-04-27 DIAGNOSIS — E2839 Other primary ovarian failure: Secondary | ICD-10-CM | POA: Insufficient documentation

## 2022-01-24 ENCOUNTER — Other Ambulatory Visit: Payer: Self-pay | Admitting: Family Medicine

## 2022-01-24 DIAGNOSIS — R238 Other skin changes: Secondary | ICD-10-CM

## 2022-04-15 NOTE — Progress Notes (Signed)
[facility] [address], [place], [place] [address] [phone]  Date:  04/23/2022   Name:  Stephanie Phillips   DOB:  05/29/1950   MRN:  [id number]  PCP:  Darreld Mclean, MD    Chief Complaint: Annual Exam (Concerns/ questions: none/)   History of Present Illness:  Stephanie Phillips is a 72 y.o. very pleasant female patient who presents with the following:  Patient seen today for physical exam- history of hypertension, GERD, prediabetes, hyperlipidemia, osteoporosis, vitamin D deficiency  Most recent visit with myself about 1 year ago  Tobacco use qualifies for lung cancer screening-at our visit last year she had cut down quite a bit and was trying to quit.  Unfortunately she was not able to quit and is smoking about a pack a day. She has tried chantix but did not work for her.  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, unfortunately her postop course was complicated by compartment syndrome with fasciotomy -she now follows up annually  She works for the [employer]- still working   Lung cancer screening can be updated-most recent 2 years ago We screened for aortic aneurysm last year, negative Can offer CT coronary calcium-she is interested, ordered for her today Recommend COVID booster DEXA scan 1 year ago Due for pneumonia booster-Prevnar Mammogram is up-to-date Colon cancer screening due next year Lab work completed 1 year ago  Fosamax Amlodipine 10 Lipitor 10  She has a problem with her right shoulder- she is being seen by Ortho with [facility] and doing some PT     Patient Active Problem List   Diagnosis Date Noted   Primary osteoarthritis of left knee 04/21/2021   Hyperlipidemia 06/24/2018   Pre-diabetes 03/15/2016   Vitamin D deficiency 03/15/2016   Gastroesophageal reflux disease  03/15/2016   Osteoporosis 06/16/2015   Benign hypertension 04/30/2013   Current tobacco use 11/04/2012    Past Medical History:  Diagnosis Date   Arthritis    Gastroesophageal reflux disease 03/15/2016   Hypertension    Osteoporosis 06/16/2015    Past Surgical History:  Procedure Laterality Date   TONSILLECTOMY      Social History   Tobacco Use   Smoking status: Every Day    Packs/day: 1.00    Years: 43.00    Total pack years: 43.00    Types: Cigarettes   Smokeless tobacco: Never  Substance Use Topics   Alcohol use: Yes    Alcohol/week: 0.0 standard drinks of alcohol   Drug use: No    Family History  Problem Relation Age of Onset   Hypertension Mother    Hypertension Father     No Known Allergies  Medication list has been reviewed and updated.  Current Outpatient Medications on File Prior to Visit  Medication Sig Dispense Refill   alendronate (FOSAMAX) 70 MG tablet TAKE 1 TABLET BY MOUTH ONCE A WEEK. TAKE WITH A FULL GLASS OF WATER ON AN EMPTY STOMACH. 12 tablet 3   amLODipine (NORVASC) 10 MG tablet Take 1 tablet (10 mg total) by mouth daily. 90 tablet 3   atorvastatin (LIPITOR) 10 MG tablet Take 1 tablet (10 mg total) by mouth daily. 90 tablet 3   triamcinolone cream (KENALOG) 0.1 % APPLY TO AFFECTED AREA TWICE A DAY 30 g 1   No current  facility-administered medications on file prior to visit.    Review of Systems:  As per HPI- otherwise negative.   Physical Examination: Vitals:   04/23/22 0814  BP: 118/70  Pulse: 68  Resp: 18  Temp: 97.8 F (36.6 C)  SpO2: 98%   Vitals:   04/23/22 0814  Weight: 166 lb 12.8 oz (75.7 kg)  Height: 5' 2.2" (1.58 m)   Body mass index is 30.31 kg/m. Ideal Body Weight: Weight in (lb) to have BMI = 25: 137.3  GEN: no acute distress.  Mildly obese, looks well HEENT: Atraumatic, Normocephalic.  Bilateral TM wnl, oropharynx normal.  PEERL,EOMI.   Ears and Nose: No external deformity. CV: RRR, No M/G/R. No JVD. No  thrill. No extra heart sounds. PULM: CTA B, no wheezes, crackles, rhonchi. No retractions. No resp. distress. No accessory muscle use. ABD: S, NT, ND, +BS. No rebound. No HSM. EXTR: No c/c/e PSYCH: Normally interactive. Conversant.    Assessment and Plan: Physical exam  Dyslipidemia - Plan: Lipid panel, CT CARDIAC SCORING (SELF PAY ONLY)  Pre-diabetes - Plan: Comprehensive metabolic panel, Hemoglobin A1c, CT CARDIAC SCORING (SELF PAY ONLY)  History of vitamin D deficiency - Plan: VITAMIN D 25 Hydroxy (Vit-D Deficiency, Fractures)  Mild anemia - Plan: CBC  Age-related osteoporosis without current pathological fracture  Essential hypertension - Plan: CBC, Comprehensive metabolic panel  Screening for thyroid disorder - Plan: TSH  Tobacco abuse - Plan: CT CHEST LUNG CA SCREEN LOW DOSE W/O CM, CT CARDIAC SCORING (SELF PAY ONLY)  Estrogen deficiency  Encounter for screening mammogram for malignant neoplasm of breast - Plan: MM 3D SCREENING MAMMOGRAM BILATERAL BREAST  Immunization due - Plan: Pneumococcal polysaccharide vaccine 23-valent greater than or equal to 2yo subcutaneous/IM  Physical exam today.  Encouraged healthy diet and exercise routine Ordered mammogram, lung cancer screening, CT coronary calcium Update pneumonia vaccine Blood pressure under good control on current medication Encouraged tobacco cessation Will plan further follow- up pending labs.  Signed Lamar Blinks, MD  Received her labs as below, message to patient  Results for orders placed or performed in visit on 04/23/22  CBC  Result Value Ref Range   WBC 8.4 4.0 - 10.5 K/uL   RBC 4.03 3.87 - 5.11 Mil/uL   Platelets 386.0 150.0 - 400.0 K/uL   Hemoglobin 12.4 12.0 - 15.0 g/dL   HCT 36.6 36.0 - 46.0 %   MCV 90.9 78.0 - 100.0 fl   MCHC 34.0 30.0 - 36.0 g/dL   RDW 13.3 11.5 - 15.5 %  Comprehensive metabolic panel  Result Value Ref Range   Sodium 139 135 - 145 mEq/L   Potassium 3.5 3.5 - 5.1 mEq/L    Chloride 103 96 - 112 mEq/L   CO2 27 19 - 32 mEq/L   Glucose, Bld 123 (H) 70 - 99 mg/dL   BUN 17 6 - 23 mg/dL   Creatinine, Ser 0.95 0.40 - 1.20 mg/dL   Total Bilirubin 0.2 0.2 - 1.2 mg/dL   Alkaline Phosphatase 99 39 - 117 U/L   AST 18 0 - 37 U/L   ALT 21 0 - 35 U/L   Total Protein 6.6 6.0 - 8.3 g/dL   Albumin 3.9 3.5 - 5.2 g/dL   GFR 61.22 >60.00 mL/min   Calcium 9.9 8.4 - 10.5 mg/dL  Hemoglobin A1c  Result Value Ref Range   Hgb A1c MFr Bld 6.1 4.6 - 6.5 %  Lipid panel  Result Value Ref Range   Cholesterol 127 0 -  200 mg/dL   Triglycerides 135.0 0.0 - 149.0 mg/dL   HDL 51.20 >39.00 mg/dL   VLDL 27.0 0.0 - 40.0 mg/dL   LDL Cholesterol 49 0 - 99 mg/dL   Total CHOL/HDL Ratio 2    NonHDL 76.23   TSH  Result Value Ref Range   TSH 1.45 0.35 - 5.50 uIU/mL  VITAMIN D 25 Hydroxy (Vit-D Deficiency, Fractures)  Result Value Ref Range   VITD 20.44 (L) 30.00 - 100.00 ng/mL

## 2022-04-15 NOTE — Patient Instructions (Addendum)
It was great to see you again today, I will be in touch with your lab work and other results asap  Please stop by imaging on the ground floor to set up your mammogram, lung cancer screening CT and heart CT (coronary calcium score)   Last pneumonia vaccine today

## 2022-04-23 ENCOUNTER — Encounter: Payer: Self-pay | Admitting: Family Medicine

## 2022-04-23 ENCOUNTER — Ambulatory Visit (INDEPENDENT_AMBULATORY_CARE_PROVIDER_SITE_OTHER): Payer: 59 | Admitting: Family Medicine

## 2022-04-23 VITALS — BP 118/70 | HR 68 | Temp 97.8°F | Resp 18 | Ht 62.2 in | Wt 166.8 lb

## 2022-04-23 DIAGNOSIS — E785 Hyperlipidemia, unspecified: Secondary | ICD-10-CM | POA: Diagnosis not present

## 2022-04-23 DIAGNOSIS — M810 Age-related osteoporosis without current pathological fracture: Secondary | ICD-10-CM

## 2022-04-23 DIAGNOSIS — Z1329 Encounter for screening for other suspected endocrine disorder: Secondary | ICD-10-CM

## 2022-04-23 DIAGNOSIS — D649 Anemia, unspecified: Secondary | ICD-10-CM | POA: Diagnosis not present

## 2022-04-23 DIAGNOSIS — Z8639 Personal history of other endocrine, nutritional and metabolic disease: Secondary | ICD-10-CM | POA: Diagnosis not present

## 2022-04-23 DIAGNOSIS — E2839 Other primary ovarian failure: Secondary | ICD-10-CM

## 2022-04-23 DIAGNOSIS — R7303 Prediabetes: Secondary | ICD-10-CM | POA: Diagnosis not present

## 2022-04-23 DIAGNOSIS — I10 Essential (primary) hypertension: Secondary | ICD-10-CM | POA: Diagnosis not present

## 2022-04-23 DIAGNOSIS — Z0000 Encounter for general adult medical examination without abnormal findings: Secondary | ICD-10-CM | POA: Diagnosis not present

## 2022-04-23 DIAGNOSIS — Z23 Encounter for immunization: Secondary | ICD-10-CM | POA: Diagnosis not present

## 2022-04-23 DIAGNOSIS — Z720 Tobacco use: Secondary | ICD-10-CM

## 2022-04-23 DIAGNOSIS — Z1231 Encounter for screening mammogram for malignant neoplasm of breast: Secondary | ICD-10-CM

## 2022-04-23 LAB — CBC
HCT: 36.6 % (ref 36.0–46.0)
Hemoglobin: 12.4 g/dL (ref 12.0–15.0)
MCHC: 34 g/dL (ref 30.0–36.0)
MCV: 90.9 fl (ref 78.0–100.0)
Platelets: 386 10*3/uL (ref 150.0–400.0)
RBC: 4.03 Mil/uL (ref 3.87–5.11)
RDW: 13.3 % (ref 11.5–15.5)
WBC: 8.4 10*3/uL (ref 4.0–10.5)

## 2022-04-23 LAB — COMPREHENSIVE METABOLIC PANEL
ALT: 21 U/L (ref 0–35)
AST: 18 U/L (ref 0–37)
Albumin: 3.9 g/dL (ref 3.5–5.2)
Alkaline Phosphatase: 99 U/L (ref 39–117)
BUN: 17 mg/dL (ref 6–23)
CO2: 27 mEq/L (ref 19–32)
Calcium: 9.9 mg/dL (ref 8.4–10.5)
Chloride: 103 mEq/L (ref 96–112)
Creatinine, Ser: 0.95 mg/dL (ref 0.40–1.20)
GFR: 61.22 mL/min (ref >60.00)
Glucose, Bld: 123 mg/dL — ABNORMAL HIGH (ref 70–99)
Potassium: 3.5 mEq/L (ref 3.5–5.1)
Sodium: 139 mEq/L (ref 135–145)
Total Bilirubin: 0.2 mg/dL (ref 0.2–1.2)
Total Protein: 6.6 g/dL (ref 6.0–8.3)

## 2022-04-23 LAB — LIPID PANEL
Cholesterol: 127 mg/dL (ref 0–200)
HDL: 51.2 mg/dL (ref >39.00)
LDL Cholesterol: 49 mg/dL (ref 0–99)
NonHDL: 76.23
Total CHOL/HDL Ratio: 2
Triglycerides: 135 mg/dL (ref 0.0–149.0)
VLDL: 27 mg/dL (ref 0.0–40.0)

## 2022-04-23 LAB — TSH: TSH: 1.45 u[IU]/mL (ref 0.35–5.50)

## 2022-04-23 LAB — HEMOGLOBIN A1C: Hgb A1c MFr Bld: 6.1 % (ref 4.6–6.5)

## 2022-04-23 LAB — VITAMIN D 25 HYDROXY (VIT D DEFICIENCY, FRACTURES): VITD: 20.44 ng/mL — ABNORMAL LOW (ref 30.00–100.00)

## 2022-04-23 MED ORDER — VITAMIN D3 1.25 MG (50000 UT) PO CAPS: ORAL_CAPSULE | ORAL | 0 refills | Status: AC

## 2022-05-07 ENCOUNTER — Telehealth: Payer: Self-pay | Admitting: Family Medicine

## 2022-05-07 NOTE — Telephone Encounter (Signed)
[facility]) called stating that pt's Chest CT has to be approved by insurance prior to scheduling. After reviewing chart, it looked like it had been authorized. However, Dawn stated the insurance had received nothing in regards to this.

## 2022-05-09 ENCOUNTER — Ambulatory Visit (HOSPITAL_BASED_OUTPATIENT_CLINIC_OR_DEPARTMENT_OTHER)
Admission: RE | Admit: 2022-05-09 | Discharge: 2022-05-09 | Disposition: A | Discharge status: Home / Self Care | Payer: 59 | Source: Ambulatory Visit | Attending: Family Medicine | Admitting: Family Medicine

## 2022-05-09 ENCOUNTER — Ambulatory Visit (HOSPITAL_BASED_OUTPATIENT_CLINIC_OR_DEPARTMENT_OTHER): Payer: 59

## 2022-05-17 ENCOUNTER — Ambulatory Visit (HOSPITAL_BASED_OUTPATIENT_CLINIC_OR_DEPARTMENT_OTHER)
Admission: RE | Admit: 2022-05-17 | Discharge: 2022-05-17 | Disposition: A | Discharge status: Home / Self Care | Payer: 59 | Source: Ambulatory Visit | Attending: Family Medicine | Admitting: Family Medicine

## 2022-05-17 DIAGNOSIS — R7303 Prediabetes: Secondary | ICD-10-CM | POA: Insufficient documentation

## 2022-05-17 DIAGNOSIS — E785 Hyperlipidemia, unspecified: Secondary | ICD-10-CM | POA: Insufficient documentation

## 2022-05-17 DIAGNOSIS — Z720 Tobacco use: Secondary | ICD-10-CM | POA: Insufficient documentation

## 2022-05-18 ENCOUNTER — Encounter: Payer: Self-pay | Admitting: Family Medicine

## 2022-05-21 ENCOUNTER — Ambulatory Visit (HOSPITAL_BASED_OUTPATIENT_CLINIC_OR_DEPARTMENT_OTHER)
Admission: RE | Admit: 2022-05-21 | Discharge: 2022-05-21 | Disposition: A | Discharge status: Home / Self Care | Payer: 59 | Source: Ambulatory Visit | Attending: Family Medicine | Admitting: Family Medicine

## 2022-05-21 ENCOUNTER — Encounter (HOSPITAL_BASED_OUTPATIENT_CLINIC_OR_DEPARTMENT_OTHER): Payer: Self-pay

## 2022-05-21 DIAGNOSIS — Z1231 Encounter for screening mammogram for malignant neoplasm of breast: Secondary | ICD-10-CM | POA: Diagnosis present

## 2022-05-29 ENCOUNTER — Other Ambulatory Visit: Payer: Self-pay | Admitting: Family Medicine

## 2022-05-29 DIAGNOSIS — E785 Hyperlipidemia, unspecified: Secondary | ICD-10-CM

## 2022-05-29 DIAGNOSIS — I10 Essential (primary) hypertension: Secondary | ICD-10-CM

## 2022-06-02 ENCOUNTER — Other Ambulatory Visit: Payer: Self-pay | Admitting: Family Medicine

## 2022-07-15 ENCOUNTER — Other Ambulatory Visit: Payer: Self-pay | Admitting: Family Medicine

## 2022-07-15 DIAGNOSIS — Z8639 Personal history of other endocrine, nutritional and metabolic disease: Secondary | ICD-10-CM

## 2022-07-17 ENCOUNTER — Encounter: Payer: Self-pay | Admitting: Family Medicine

## 2022-12-24 ENCOUNTER — Other Ambulatory Visit: Payer: Self-pay

## 2022-12-24 ENCOUNTER — Emergency Department (HOSPITAL_BASED_OUTPATIENT_CLINIC_OR_DEPARTMENT_OTHER): Payer: 59

## 2022-12-24 ENCOUNTER — Emergency Department (HOSPITAL_BASED_OUTPATIENT_CLINIC_OR_DEPARTMENT_OTHER)
Admission: EM | Admit: 2022-12-24 | Discharge: 2022-12-25 | Disposition: A | Discharge status: Home / Self Care | Payer: 59 | Attending: Emergency Medicine | Admitting: Emergency Medicine

## 2022-12-24 ENCOUNTER — Encounter (HOSPITAL_BASED_OUTPATIENT_CLINIC_OR_DEPARTMENT_OTHER): Payer: Self-pay

## 2022-12-24 DIAGNOSIS — R55 Syncope and collapse: Secondary | ICD-10-CM | POA: Insufficient documentation

## 2022-12-24 DIAGNOSIS — F17210 Nicotine dependence, cigarettes, uncomplicated: Secondary | ICD-10-CM | POA: Diagnosis not present

## 2022-12-24 DIAGNOSIS — E876 Hypokalemia: Secondary | ICD-10-CM | POA: Diagnosis not present

## 2022-12-24 DIAGNOSIS — Z79899 Other long term (current) drug therapy: Secondary | ICD-10-CM | POA: Insufficient documentation

## 2022-12-24 DIAGNOSIS — I10 Essential (primary) hypertension: Secondary | ICD-10-CM | POA: Insufficient documentation

## 2022-12-24 DIAGNOSIS — L509 Urticaria, unspecified: Secondary | ICD-10-CM | POA: Diagnosis present

## 2022-12-24 LAB — CBC
HCT: 40.3 % (ref 36.0–46.0)
Hemoglobin: 13.7 g/dL (ref 12.0–15.0)
MCH: 30.6 pg (ref 26.0–34.0)
MCHC: 34 g/dL (ref 30.0–36.0)
MCV: 90 fL (ref 80.0–100.0)
Platelets: 405 10*3/uL — ABNORMAL HIGH (ref 150–400)
RBC: 4.48 MIL/uL (ref 3.87–5.11)
RDW: 12.6 % (ref 11.5–15.5)
WBC: 17.3 10*3/uL — ABNORMAL HIGH (ref 4.0–10.5)
nRBC: 0 % (ref 0.0–0.2)

## 2022-12-24 LAB — MAGNESIUM: Magnesium: 1.6 mg/dL — ABNORMAL LOW (ref 1.7–2.4)

## 2022-12-24 LAB — COMPREHENSIVE METABOLIC PANEL
ALT: 17 U/L (ref 0–44)
AST: 24 U/L (ref 15–41)
Albumin: 4.2 g/dL (ref 3.5–5.0)
Alkaline Phosphatase: 83 U/L (ref 38–126)
Anion gap: 11 (ref 5–15)
BUN: 13 mg/dL (ref 8–23)
CO2: 25 mmol/L (ref 22–32)
Calcium: 9.1 mg/dL (ref 8.9–10.3)
Chloride: 102 mmol/L (ref 98–111)
Creatinine, Ser: 1.19 mg/dL — ABNORMAL HIGH (ref 0.44–1.00)
GFR, Estimated: 49 mL/min — ABNORMAL LOW (ref >60)
Glucose, Bld: 129 mg/dL — ABNORMAL HIGH (ref 70–99)
Potassium: 2.7 mmol/L — CL (ref 3.5–5.1)
Sodium: 138 mmol/L (ref 135–145)
Total Bilirubin: 0.8 mg/dL (ref <1.2)
Total Protein: 7.7 g/dL (ref 6.5–8.1)

## 2022-12-24 LAB — TROPONIN I (HIGH SENSITIVITY)
Troponin I (High Sensitivity): 7 ng/L (ref <18)
Troponin I (High Sensitivity): 8 ng/L (ref <18)

## 2022-12-24 MED ORDER — POTASSIUM CHLORIDE ER 20 MEQ PO TBCR: 20.0000 meq | EXTENDED_RELEASE_TABLET | Freq: Every day | ORAL | 0 refills | Status: DC

## 2022-12-24 MED ORDER — METHYLPREDNISOLONE SODIUM SUCC 125 MG IJ SOLR
125.0000 mg | Freq: Once | INTRAMUSCULAR | Status: AC
  Administered 2022-12-24: 125 mg via INTRAVENOUS
  Filled 2022-12-24: qty 2

## 2022-12-24 MED ORDER — POTASSIUM CHLORIDE CRYS ER 20 MEQ PO TBCR
40.0000 meq | EXTENDED_RELEASE_TABLET | Freq: Once | ORAL | Status: AC
  Administered 2022-12-24: 40 meq via ORAL
  Filled 2022-12-24: qty 2

## 2022-12-24 MED ORDER — SODIUM CHLORIDE 0.9 % IV BOLUS
1000.0000 mL | Freq: Once | INTRAVENOUS | Status: AC
  Administered 2022-12-24: 1000 mL via INTRAVENOUS

## 2022-12-24 MED ORDER — DIPHENHYDRAMINE HCL 50 MG/ML IJ SOLN
50.0000 mg | Freq: Once | INTRAMUSCULAR | Status: AC
  Administered 2022-12-24: 50 mg via INTRAVENOUS
  Filled 2022-12-24: qty 1

## 2022-12-24 MED ORDER — DIPHENHYDRAMINE HCL 50 MG/ML IJ SOLN: 25.0000 mg | Freq: Once | INTRAMUSCULAR | Status: DC

## 2022-12-24 MED ORDER — FAMOTIDINE IN NACL 20-0.9 MG/50ML-% IV SOLN
20.0000 mg | Freq: Once | INTRAVENOUS | Status: AC
  Administered 2022-12-24: 20 mg via INTRAVENOUS
  Filled 2022-12-24: qty 50

## 2022-12-24 MED ORDER — MAGNESIUM SULFATE 2 GM/50ML IV SOLN
2.0000 g | Freq: Once | INTRAVENOUS | Status: AC
  Administered 2022-12-24 (×2): 2 g via INTRAVENOUS
  Filled 2022-12-24: qty 50

## 2022-12-24 MED ORDER — POTASSIUM CHLORIDE 10 MEQ/100ML IV SOLN
10.0000 meq | INTRAVENOUS | Status: AC
  Administered 2022-12-24 – 2022-12-25 (×3): 10 meq via INTRAVENOUS
  Filled 2022-12-24 (×3): qty 100

## 2022-12-24 NOTE — ED Triage Notes (Addendum)
Pt reports she thinks she may have been bitten by an insect today when putting flowers on her mother's gravestone. She presents with itchy red rash over her body. She has not taken any benadryl. She also reports feeling nauseous and has a headache. Pt A&Ox4, airway patent, no shortness of breath.

## 2022-12-24 NOTE — ED Provider Notes (Incomplete)
[facility] Provider Note   CSN: [id number] Arrival date & time: 12/24/22  1744     History {Add pertinent medical, surgical, social history, OB history to HPI:1} Chief Complaint  Patient presents with  . Pruritis    Stephanie Phillips is a 72 y.o. female past med history significant for current tobacco abuse, 1 pack/day of cigarettes, hypertension, GERD presents with concern for pruritus, rash, syncope today.  Patient reports that she was visiting her mother's grave stone, had not had much to eat or drink, and then had several insect bites on arms.  She had significant amount of redness, and itching at home, did not take anything for the symptoms.  She reports that she felt short of breath, some nausea and vomiting, and had an episode of syncope.  She denies any chest pain, ongoing shortness of breath, numbness, tingling, vision changes.  HPI     Home Medications Prior to Admission medications   Medication Sig Start Date End Date Taking? Authorizing Provider  amLODipine (NORVASC) 10 MG tablet Take 1 tablet (10 mg total) by mouth daily. 05/29/22   Copland, Gwenlyn Found, MD  atorvastatin (LIPITOR) 10 MG tablet Take 1 tablet (10 mg total) by mouth daily. 05/29/22   Copland, Gwenlyn Found, MD  alendronate (FOSAMAX) 70 MG tablet TAKE 1 TABLET BY MOUTH ONCE A WEEK. TAKE WITH A FULL GLASS OF WATER ON AN EMPTY STOMACH. 04/17/21   Copland, Gwenlyn Found, MD  Cholecalciferol (VITAMIN D3) 1.25 MG (50000 UT) capsule Take 1 weekly for 12 weeks 04/23/22   Copland, Gwenlyn Found, MD  triamcinolone cream (KENALOG) 0.1 % APPLY TO AFFECTED AREA TWICE A DAY 01/24/22   Copland, Gwenlyn Found, MD      Allergies    Patient has no known allergies.    Review of Systems   Review of Systems  All other systems reviewed and are negative.   Physical Exam Updated Vital Signs BP 128/88 (BP Location: Left Arm)   Pulse 87   Temp 97.7 F (36.5 C)   Resp 18   Ht 5\' 2"  (1.575 m)   Wt 68  kg   SpO2 96%   BMI 27.44 kg/m  Physical Exam Vitals and nursing note reviewed.  Constitutional:      General: She is not in acute distress.    Appearance: Normal appearance.  HENT:     Head: Normocephalic and atraumatic.  Eyes:     General:        Right eye: No discharge.        Left eye: No discharge.  Cardiovascular:     Rate and Rhythm: Normal rate and regular rhythm.     Heart sounds: No murmur heard.    No friction rub. No gallop.  Pulmonary:     Effort: Pulmonary effort is normal.     Breath sounds: Normal breath sounds.     Comments: Mild wheeze on expiration on the right, otherwise clear breath sounds bilaterally. Abdominal:     General: Bowel sounds are normal.     Palpations: Abdomen is soft.  Skin:    General: Skin is warm and dry.     Capillary Refill: Capillary refill takes less than 2 seconds.     Comments: Scattered red erythematous rash on bilateral arms, nonhive-like in appearance, no central clearing, no targetoid lesions, negative Nikolsky sign.  Neurological:     Mental Status: She is alert and oriented to person, place, and time.  Psychiatric:  Mood and Affect: Mood normal.        Behavior: Behavior normal.     ED Results / Procedures / Treatments   Labs (all labs ordered are listed, but only abnormal results are displayed) Labs Reviewed - No data to display  EKG None  Radiology No results found.  Procedures Procedures  {Document cardiac monitor, telemetry assessment procedure when appropriate:1}  Medications Ordered in ED Medications - No data to display  ED Course/ Medical Decision Making/ A&P Clinical Course as of 12/24/22 2317  Mon Dec 24, 2022  2308 20mg  kclor x 5 days 2/2 hypokalemia [CP]    Clinical Course User Index [CP] Montel Clock, Jackolyn Geron H, PA-C   {   Click here for ABCD2, HEART and other calculatorsREFRESH Note before signing :1}                              Medical Decision Making Amount and/or Complexity of  Data Reviewed Labs: ordered. Radiology: ordered.  Risk Prescription drug management.   This patient is a 72 y.o. female  who presents to the ED for concern of ***.   Differential diagnoses prior to evaluation: The emergent differential diagnosis includes, but is not limited to,  *** . This is not an exhaustive differential.   Past Medical History / Co-morbidities / Social History: ***  Additional history: Chart reviewed. Pertinent results include: ***  Physical Exam: Physical exam performed. The pertinent findings include: ***  Lab Tests/Imaging studies: I personally interpreted labs/imaging and the pertinent results include:  ***. ***I agree with the radiologist interpretation.  Cardiac monitoring: EKG obtained and interpreted by myself and attending physician which shows: ***   Medications: I ordered medication including ***.  I have reviewed the patients home medicines and have made adjustments as needed.   Disposition: After consideration of the diagnostic results and the patients response to treatment, I feel that *** .   ***emergency department workup does not suggest an emergent condition requiring admission or immediate intervention beyond what has been performed at this time. The plan is: ***. The patient is safe for discharge and has been instructed to return immediately for worsening symptoms, change in symptoms or any other concerns.  Final Clinical Impression(s) / ED Diagnoses Final diagnoses:  None    Rx / DC Orders ED Discharge Orders     None

## 2022-12-24 NOTE — Discharge Instructions (Addendum)
I have sent some potassium supplementation to your pharmacy, recommended taking until the prescription runs out daily, recommend following up department care doctor for potassium recheck around this.  Recommend increasing your oral intake, making sure that you are drinking plenty of fluid and eating 3 meals a day.  Please return if you have any additional episodes of loss of consciousness.  You can use Benadryl as needed for any ongoing hives, and over-the-counter anti-itch lotion, called Sarna as needed for any additional itching.

## 2022-12-24 NOTE — ED Provider Notes (Signed)
[place] [facility] AT [facility] Provider Note   CSN: [id number] Arrival date & time: 12/24/22  1744     History {Add pertinent medical, surgical, social history, OB history to HPI:1} Chief Complaint  Patient presents with  . Pruritis    Stephanie Phillips is a 72 y.o. female past med history significant for current tobacco abuse, 1 pack/day of cigarettes, hypertension, GERD presents with concern for pruritus, rash, syncope today.  Patient reports that she was visiting her mother's grave stone, had not had much to eat or drink, and then had several insect bites on arms.  She had significant amount of redness, and itching at home, did not take anything for the symptoms.  She reports that she felt short of breath, some nausea and vomiting, and had an episode of syncope.  She denies any chest pain, ongoing shortness of breath, numbness, tingling, vision changes.  HPI     Home Medications Prior to Admission medications   Medication Sig Start Date End Date Taking? Authorizing Provider  amLODipine (NORVASC) 10 MG tablet Take 1 tablet (10 mg total) by mouth daily. 05/29/22   Copland, Gwenlyn Found, MD  atorvastatin (LIPITOR) 10 MG tablet Take 1 tablet (10 mg total) by mouth daily. 05/29/22   Copland, Gwenlyn Found, MD  alendronate (FOSAMAX) 70 MG tablet TAKE 1 TABLET BY MOUTH ONCE A WEEK. TAKE WITH A FULL GLASS OF WATER ON AN EMPTY STOMACH. 04/17/21   Copland, Gwenlyn Found, MD  Cholecalciferol (VITAMIN D3) 1.25 MG (50000 UT) capsule Take 1 weekly for 12 weeks 04/23/22   Copland, Gwenlyn Found, MD  triamcinolone cream (KENALOG) 0.1 % APPLY TO AFFECTED AREA TWICE A DAY 01/24/22   Copland, Gwenlyn Found, MD      Allergies    Patient has no known allergies.    Review of Systems   Review of Systems  All other systems reviewed and are negative.   Physical Exam Updated Vital Signs BP 128/88 (BP Location: Left Arm)   Pulse 87   Temp 97.7 F (36.5 C)   Resp 18   Ht 5\' 2"  (1.575 m)   Wt 68  kg   SpO2 96%   BMI 27.44 kg/m  Physical Exam Vitals and nursing note reviewed.  Constitutional:      General: She is not in acute distress.    Appearance: Normal appearance.  HENT:     Head: Normocephalic and atraumatic.  Eyes:     General:        Right eye: No discharge.        Left eye: No discharge.  Cardiovascular:     Rate and Rhythm: Normal rate and regular rhythm.     Heart sounds: No murmur heard.    No friction rub. No gallop.  Pulmonary:     Effort: Pulmonary effort is normal.     Breath sounds: Normal breath sounds.     Comments: Mild wheeze on expiration on the right, otherwise clear breath sounds bilaterally. Abdominal:     General: Bowel sounds are normal.     Palpations: Abdomen is soft.  Skin:    General: Skin is warm and dry.     Capillary Refill: Capillary refill takes less than 2 seconds.     Comments: Scattered red erythematous rash on bilateral arms, nonhive-like in appearance, no central clearing, no targetoid lesions, negative Nikolsky sign.  Neurological:     Mental Status: She is alert and oriented to person, place, and time.  Psychiatric:  Mood and Affect: Mood normal.        Behavior: Behavior normal.     ED Results / Procedures / Treatments   Labs (all labs ordered are listed, but only abnormal results are displayed) Labs Reviewed - No data to display  EKG None  Radiology No results found.  Procedures Procedures  {Document cardiac monitor, telemetry assessment procedure when appropriate:1}  Medications Ordered in ED Medications - No data to display  ED Course/ Medical Decision Making/ A&P Clinical Course as of 12/24/22 2317  Mon Dec 24, 2022  2308 20mg  kclor x 5 days 2/2 hypokalemia [CP]    Clinical Course User Index [CP] Montel Clock, Jarrin Staley H, PA-C   {   Click here for ABCD2, HEART and other calculatorsREFRESH Note before signing :1}                              Medical Decision Making  This patient is a 72 y.o.  female  who presents to the ED for concern of ***.   Differential diagnoses prior to evaluation: The emergent differential diagnosis includes, but is not limited to,  *** . This is not an exhaustive differential.   Past Medical History / Co-morbidities / Social History: ***  Additional history: Chart reviewed. Pertinent results include: ***  Physical Exam: Physical exam performed. The pertinent findings include: ***  Lab Tests/Imaging studies: I personally interpreted labs/imaging and the pertinent results include:  ***. ***I agree with the radiologist interpretation.  Cardiac monitoring: EKG obtained and interpreted by myself and attending physician which shows: ***   Medications: I ordered medication including ***.  I have reviewed the patients home medicines and have made adjustments as needed.   Disposition: After consideration of the diagnostic results and the patients response to treatment, I feel that *** .   ***emergency department workup does not suggest an emergent condition requiring admission or immediate intervention beyond what has been performed at this time. The plan is: ***. The patient is safe for discharge and has been instructed to return immediately for worsening symptoms, change in symptoms or any other concerns.  Final Clinical Impression(s) / ED Diagnoses Final diagnoses:  None    Rx / DC Orders ED Discharge Orders     None

## 2022-12-25 LAB — URINALYSIS, ROUTINE W REFLEX MICROSCOPIC
Glucose, UA: NEGATIVE mg/dL
Hgb urine dipstick: NEGATIVE
Ketones, ur: 40 mg/dL — AB
Nitrite: NEGATIVE
Protein, ur: 30 mg/dL — AB
Specific Gravity, Urine: >=1.03 (ref 1.005–1.030)
pH: 6 (ref 5.0–8.0)

## 2022-12-25 LAB — URINALYSIS, MICROSCOPIC (REFLEX)

## 2023-03-29 ENCOUNTER — Encounter (HOSPITAL_BASED_OUTPATIENT_CLINIC_OR_DEPARTMENT_OTHER): Payer: Self-pay

## 2023-03-29 ENCOUNTER — Other Ambulatory Visit: Payer: Self-pay

## 2023-03-29 ENCOUNTER — Emergency Department (HOSPITAL_BASED_OUTPATIENT_CLINIC_OR_DEPARTMENT_OTHER)
Admission: EM | Admit: 2023-03-29 | Discharge: 2023-03-29 | Disposition: A | Discharge status: Home / Self Care | Payer: 59 | Attending: Emergency Medicine | Admitting: Emergency Medicine

## 2023-03-29 DIAGNOSIS — K148 Other diseases of tongue: Secondary | ICD-10-CM

## 2023-03-29 DIAGNOSIS — K1379 Other lesions of oral mucosa: Secondary | ICD-10-CM | POA: Diagnosis present

## 2023-03-29 MED ORDER — LIDOCAINE-EPINEPHRINE (PF) 2 %-1:200000 IJ SOLN
10.0000 mL | Freq: Once | INTRAMUSCULAR | Status: AC
  Administered 2023-03-29: 10 mL
  Filled 2023-03-29: qty 20

## 2023-03-29 MED ORDER — SILVER NITRATE-POT NITRATE 75-25 % EX MISC
1.0000 | Freq: Once | CUTANEOUS | Status: AC
  Administered 2023-03-29: 1 via TOPICAL
  Filled 2023-03-29: qty 10

## 2023-03-29 NOTE — ED Provider Notes (Signed)
EMER[facility]ENCY DEPARTMENT AT [facility] Provider Note   CSN: [id number] Arrival date & time: 03/29/23  2156     History  Chief Complaint  Patient presents with   tongue bleeding    Sueellen Kayes is a 73 y.o. female.  Patient is a 73 year old female who presents with bleeding from a lesion on her tongue.  She states that she had a lesion on her tongue in 2021.  It was biopsied and determined to be benign.  She is on oral surgeon who did a laser treatment on it.  It seems to have come back recently.  It has been bleeding intermittently for a long time but today it started bleeding and she cannot get it to stop.  She says normally she is able to suck on some ice and it stops but today it did not.  She is not on anticoagulants.  She has an upcoming appointment with her oral surgeon on Wednesday.       Home Medications Prior to Admission medications   Medication Sig Start Date End Date Taking? Authorizing Provider  amLODipine (NORVASC) 10 MG tablet Take 1 tablet (10 mg total) by mouth daily. 05/29/22   Copland, Gwenlyn Found, MD  atorvastatin (LIPITOR) 10 MG tablet Take 1 tablet (10 mg total) by mouth daily. 05/29/22   Copland, Gwenlyn Found, MD  alendronate (FOSAMAX) 70 MG tablet TAKE 1 TABLET BY MOUTH ONCE A WEEK. TAKE WITH A FULL GLASS OF WATER ON AN EMPTY STOMACH. 04/17/21   Copland, Gwenlyn Found, MD  Cholecalciferol (VITAMIN D3) 1.25 MG (50000 UT) capsule Take 1 weekly for 12 weeks 04/23/22   Copland, Gwenlyn Found, MD  potassium chloride 20 MEQ TBCR Take 1 tablet (20 mEq total) by mouth daily. 12/24/22   Prosperi, Christian H, PA-C  triamcinolone cream (KENALOG) 0.1 % APPLY TO AFFECTED AREA TWICE A DAY 01/24/22   Copland, Gwenlyn Found, MD      Allergies    Patient has no known allergies.    Review of Systems   Review of Systems  Constitutional:  Negative for fever.  HENT:  Negative for trouble swallowing.   Skin:  Positive for wound.    Physical Exam Updated Vital Signs BP  (!) 145/85 (BP Location: Left Arm)   Pulse 74   Temp 98.5 F (36.9 C) (Oral)   Resp 18   Ht 5\' 2"  (1.575 m)   Wt 72.6 kg   SpO2 98%   BMI 29.26 kg/m  Physical Exam Constitutional:      Appearance: Normal appearance.  HENT:     Head: Normocephalic and atraumatic.     Mouth/Throat:     Comments: Patient has a skin tag looking lesion to the middle of her tongue.  There are some blood oozing from the base of the lesion Cardiovascular:     Rate and Rhythm: Normal rate.  Pulmonary:     Effort: Pulmonary effort is normal.  Skin:    General: Skin is warm and dry.  Neurological:     Mental Status: She is alert and oriented to person, place, and time.     ED Results / Procedures / Treatments   Labs (all labs ordered are listed, but only abnormal results are displayed) Labs Reviewed - No data to display  EKG None  Radiology No results found.  Procedures Procedures    Medications Ordered in ED Medications  lidocaine-EPINEPHrine (XYLOCAINE W/EPI) 2 %-1:200000 (PF) injection 10 mL (10 mLs Infiltration Given by Other  03/29/23 2223)  silver nitrate applicators applicator 1 Application (1 Application Topical Given by Other 03/29/23 2223)    ED Course/ Medical Decision Making/ A&P                                 Medical Decision Making Risk Prescription drug management.   Patient presents with a tongue lesion that is bleeding.  It is oozing from the base of the wound.    Procedure note: The wound was anesthetized with 2% lidocaine with epinephrine.  The base of the lesion was then cauterized with silver nitrate.  This seems to have stopped the bleeding.  Patient was monitored in the ED and had no recurrence of bleeding.  She was discharged home in good condition.  She has an appointment next week to follow-up with her oral surgeon for removal of the lesion  {Final Clinical Impression(s) / ED Diagnoses Final diagnoses:  Tongue lesion    Rx / DC Orders ED Discharge  Orders     None         Rolan Bucco, MD 03/29/23 2253

## 2023-03-29 NOTE — Discharge Instructions (Addendum)
Follow-up with your oral surgeon next week as scheduled.  Return to the emergency room if you have any worsening symptoms.

## 2023-03-29 NOTE — ED Triage Notes (Addendum)
Patient arrived POV c/o tongue bleeding that started around 4-5pm today. Patient states she was dx'd with a "benign tongue lesion in 2021." Patient states bleeding started randomly today and has "been non-stop." Patient states she placed some ice on her tongue earlier "and her tongue stopped bleeding for a little bit." Patient states she has an appt with oral surgery on 2/25. Bleeding is controlled during triage.

## 2023-04-17 NOTE — Patient Instructions (Addendum)
 It was great to see you today, I will be in touch with your lab work You are due for lung cancer screening, mammogram, bone density screening next month-please schedule at your convenience Recommend dose of RSV vaccine, COVID booster if none the last 6 months  Keep the skin tag removal site clean and dry until tomorrow- then bathe as usual.  Band-aid as needed If any sign of infection or bleeding please contact me

## 2023-04-17 NOTE — Progress Notes (Addendum)
 [facility] [address] [place], [place] [address] [phone]  Date:  04/24/2023   Name:  Stephanie Phillips   DOB:  1950-10-20   MRN:  [id number]  PCP:  Pearline Cables, MD    Chief Complaint: Annual Exam (Concerns/ questions: 1. lesion is to be removed tomorrow 04/25/23. 2. Skin tag/Flu shot today: declines/Lung CT due after 05/17/23)   History of Present Illness:  Stephanie Phillips is a 73 y.o. 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  Most recent visit was in December   She works for the [employer]- still working  She was in the ER last month because her tongue was bleeding.  She has a lesion on her tongue which is being followed by oral surgery She is getting this removed tomorrow   Lung cancer screening can be updated-just recently completed in April of last year Flu vaccine- declines  COVID booster- recommended s Due for colonoscopy this year Mammogram is also due in April Can update bone density today Shingrix is complete Recommend RSV She has some blood work done in November when she was seen in the ER with syncope-due for updated labs today She had low K at that time   Fosamax Amlodipine 10 Atorvastatin   She notes her left knee is painful- esp with weather changes.  It has bothered her for the last 2-3 months  She is using voltaren gel and tylenol as needed   Patient Active Problem List   Diagnosis Date Noted   Primary osteoarthritis of left knee 04/21/2021   Hyperlipidemia 06/24/2018    Pre-diabetes 03/15/2016   Vitamin D deficiency 03/15/2016   Gastroesophageal reflux disease 03/15/2016   Osteoporosis 06/16/2015   Benign hypertension 04/30/2013   Current tobacco use 11/04/2012    Past Medical History:  Diagnosis Date   Arthritis    Gastroesophageal reflux disease 03/15/2016   Hypertension    Osteoporosis 06/16/2015    Past Surgical History:  Procedure Laterality Date   PERIPHERAL VASCULAR THROMBECTOMY Right    patient reports she had DVT in right leg ~2018/19   TONSILLECTOMY      Social History   Tobacco Use   Smoking status: Every Day    Current packs/day: 1.00    Average packs/day: 1 pack/day for 43.0 years (43.0 ttl pk-yrs)    Types: Cigarettes   Smokeless tobacco: Never  Vaping Use   Vaping status: Never Used  Substance Use Topics   Alcohol use: Not Currently   Drug use: Yes    Types: Marijuana    Comment: once a week    Family History  Problem Relation Age of Onset   Hypertension Mother    Hypertension Father     No Known Allergies  Medication list has been reviewed and updated.  Current Outpatient Medications on File Prior to Visit  Medication Sig Dispense Refill   alendronate (FOSAMAX) 70 MG tablet TAKE 1 TABLET BY MOUTH ONCE A WEEK. TAKE WITH A FULL GLASS OF WATER ON AN EMPTY STOMACH. 12 tablet 3   amLODipine (NORVASC)  10 MG tablet Take 1 tablet (10 mg total) by mouth daily. 90 tablet 3   atorvastatin (LIPITOR) 10 MG tablet Take 1 tablet (10 mg total) by mouth daily. 90 tablet 3   Cholecalciferol (VITAMIN D3) 1.25 MG (50000 UT) capsule Take 1 weekly for 12 weeks 12 capsule 0   triamcinolone cream (KENALOG) 0.1 % APPLY TO AFFECTED AREA TWICE A DAY 30 g 1   No current facility-administered medications on file prior to visit.    Review of Systems:  As per HPI- otherwise negative.   Physical Examination: Vitals:   04/24/23 0813  BP: (!) 142/80  Pulse: 84  Resp: 18  Temp: 97.7 F (36.5 C)  SpO2: 96%   Vitals:   04/24/23  0813  Weight: 164 lb 6.4 oz (74.6 kg)  Height: 5' 2.5" (1.588 m)   Body mass index is 29.59 kg/m. Ideal Body Weight: Weight in (lb) to have BMI = 25: 138.6  GEN: no acute distress. HEENT: Atraumatic, Normocephalic.  Ears and Nose: No external deformity. CV: RRR, No M/G/R. No JVD. No thrill. No extra heart sounds. PULM: CTA B, no wheezes, crackles, rhonchi. No retractions. No resp. distress. No accessory muscle use. ABD: S, NT, ND, +BS. No rebound. No HSM. EXTR: No c/c/e PSYCH: Normally interactive. Conversant.   Pt notes a skin tag under her left arm which is bothersome, she requests that I remove it and gives consent for procedure Area prepped with betadine and alcohol Snipped skin tag under left.  No complications, no bleeding, she tolerated well,  dressed with band- aid  Assessment and Plan: Physical exam  Dyslipidemia - Plan: Lipid panel  Pre-diabetes - Plan: Comprehensive metabolic panel, Hemoglobin A1c  History of vitamin D deficiency - Plan: VITAMIN D 25 Hydroxy (Vit-D Deficiency, Fractures)  Mild anemia - Plan: CBC  Age-related osteoporosis without current pathological fracture - Plan: DG Bone Density  Essential hypertension - Plan: CBC, Comprehensive metabolic panel  Screening for thyroid disorder - Plan: TSH  Tobacco abuse - Plan: CT CHEST LUNG CA SCREEN LOW DOSE W/O CM  Physical exam today.  Encouraged healthy diet and exercise routine Lab work pending Ordered lung cancer screening CT as well as DEXA scan  It was great to see you today, I will be in touch with your lab work You are due for lung cancer screening, mammogram, bone density screening next month-please schedule at your convenience Recommend dose of RSV vaccine, COVID booster if none the last 6 months  Keep the skin tag removal site clean and dry until tomorrow- then bathe as usual.  Band-aid as needed If any sign of infection or bleeding please contact me  Signed Abbe Amsterdam, MD  Received  her labs as below, message to patient Results for orders placed or performed in visit on 04/24/23  CBC   Collection Time: 04/24/23  8:42 AM  Result Value Ref Range   WBC 8.0 4.0 - 10.5 K/uL   RBC 3.54 (L) 3.87 - 5.11 Mil/uL   Platelets 385.0 150.0 - 400.0 K/uL   Hemoglobin 11.0 (L) 12.0 - 15.0 g/dL   HCT 53.6 (L) 64.4 - 03.4 %   MCV 92.4 78.0 - 100.0 fl   MCHC 33.5 30.0 - 36.0 g/dL   RDW 74.2 59.5 - 63.8 %  Comprehensive metabolic panel   Collection Time: 04/24/23  8:42 AM  Result Value Ref Range   Sodium 141 135 - 145 mEq/L   Potassium 3.6 3.5 - 5.1 mEq/L   Chloride  105 96 - 112 mEq/L   CO2 27 19 - 32 mEq/L   Glucose, Bld 105 (H) 70 - 99 mg/dL   BUN 13 6 - 23 mg/dL   Creatinine, Ser 9.52 0.40 - 1.20 mg/dL   Total Bilirubin 0.2 0.2 - 1.2 mg/dL   Alkaline Phosphatase 98 39 - 117 U/L   AST 14 0 - 37 U/L   ALT 12 0 - 35 U/L   Total Protein 6.7 6.0 - 8.3 g/dL   Albumin 4.2 3.5 - 5.2 g/dL   GFR 84.13 >24.40 mL/min   Calcium 9.4 8.4 - 10.5 mg/dL  Hemoglobin N0U   Collection Time: 04/24/23  8:42 AM  Result Value Ref Range   Hgb A1c MFr Bld 5.8 4.6 - 6.5 %  Lipid panel   Collection Time: 04/24/23  8:42 AM  Result Value Ref Range   Cholesterol 114 0 - 200 mg/dL   Triglycerides 725.3 0.0 - 149.0 mg/dL   HDL 66.44 >03.47 mg/dL   VLDL 42.5 0.0 - 95.6 mg/dL   LDL Cholesterol 42 0 - 99 mg/dL   Total CHOL/HDL Ratio 2    NonHDL 63.71   TSH   Collection Time: 04/24/23  8:42 AM  Result Value Ref Range   TSH 1.20 0.35 - 5.50 uIU/mL  VITAMIN D 25 Hydroxy (Vit-D Deficiency, Fractures)   Collection Time: 04/24/23  8:42 AM  Result Value Ref Range   VITD 32.43 30.00 - 100.00 ng/mL

## 2023-04-24 ENCOUNTER — Encounter: Payer: Self-pay | Admitting: Family Medicine

## 2023-04-24 ENCOUNTER — Ambulatory Visit (INDEPENDENT_AMBULATORY_CARE_PROVIDER_SITE_OTHER): Payer: 59 | Admitting: Family Medicine

## 2023-04-24 VITALS — BP 120/70 | HR 84 | Temp 97.7°F | Resp 18 | Ht 62.5 in | Wt 164.4 lb

## 2023-04-24 DIAGNOSIS — R7303 Prediabetes: Secondary | ICD-10-CM | POA: Diagnosis not present

## 2023-04-24 DIAGNOSIS — E785 Hyperlipidemia, unspecified: Secondary | ICD-10-CM

## 2023-04-24 DIAGNOSIS — I10 Essential (primary) hypertension: Secondary | ICD-10-CM | POA: Diagnosis not present

## 2023-04-24 DIAGNOSIS — Z8639 Personal history of other endocrine, nutritional and metabolic disease: Secondary | ICD-10-CM | POA: Diagnosis not present

## 2023-04-24 DIAGNOSIS — D649 Anemia, unspecified: Secondary | ICD-10-CM | POA: Diagnosis not present

## 2023-04-24 DIAGNOSIS — Z1329 Encounter for screening for other suspected endocrine disorder: Secondary | ICD-10-CM | POA: Diagnosis not present

## 2023-04-24 DIAGNOSIS — Z720 Tobacco use: Secondary | ICD-10-CM

## 2023-04-24 DIAGNOSIS — Z0000 Encounter for general adult medical examination without abnormal findings: Secondary | ICD-10-CM

## 2023-04-24 DIAGNOSIS — M810 Age-related osteoporosis without current pathological fracture: Secondary | ICD-10-CM

## 2023-04-24 LAB — COMPREHENSIVE METABOLIC PANEL
ALT: 12 U/L (ref 0–35)
AST: 14 U/L (ref 0–37)
Albumin: 4.2 g/dL (ref 3.5–5.2)
Alkaline Phosphatase: 98 U/L (ref 39–117)
BUN: 13 mg/dL (ref 6–23)
CO2: 27 meq/L (ref 19–32)
Calcium: 9.4 mg/dL (ref 8.4–10.5)
Chloride: 105 meq/L (ref 96–112)
Creatinine, Ser: 0.86 mg/dL (ref 0.40–1.20)
GFR: 68.5 mL/min (ref >60.00)
Glucose, Bld: 105 mg/dL — ABNORMAL HIGH (ref 70–99)
Potassium: 3.6 meq/L (ref 3.5–5.1)
Sodium: 141 meq/L (ref 135–145)
Total Bilirubin: 0.2 mg/dL (ref 0.2–1.2)
Total Protein: 6.7 g/dL (ref 6.0–8.3)

## 2023-04-24 LAB — LIPID PANEL
Cholesterol: 114 mg/dL (ref 0–200)
HDL: 50.3 mg/dL (ref >39.00)
LDL Cholesterol: 42 mg/dL (ref 0–99)
NonHDL: 63.71
Total CHOL/HDL Ratio: 2
Triglycerides: 109 mg/dL (ref 0.0–149.0)
VLDL: 21.8 mg/dL (ref 0.0–40.0)

## 2023-04-24 LAB — CBC
HCT: 32.7 % — ABNORMAL LOW (ref 36.0–46.0)
Hemoglobin: 11 g/dL — ABNORMAL LOW (ref 12.0–15.0)
MCHC: 33.5 g/dL (ref 30.0–36.0)
MCV: 92.4 fl (ref 78.0–100.0)
Platelets: 385 10*3/uL (ref 150.0–400.0)
RBC: 3.54 Mil/uL — ABNORMAL LOW (ref 3.87–5.11)
RDW: 13.6 % (ref 11.5–15.5)
WBC: 8 10*3/uL (ref 4.0–10.5)

## 2023-04-24 LAB — VITAMIN D 25 HYDROXY (VIT D DEFICIENCY, FRACTURES): VITD: 32.43 ng/mL (ref 30.00–100.00)

## 2023-04-24 LAB — HEMOGLOBIN A1C: Hgb A1c MFr Bld: 5.8 % (ref 4.6–6.5)

## 2023-04-24 LAB — TSH: TSH: 1.2 u[IU]/mL (ref 0.35–5.50)

## 2023-04-24 MED ORDER — ATORVASTATIN CALCIUM 10 MG PO TABS: 10.0000 mg | ORAL_TABLET | Freq: Every day | ORAL | 3 refills | Status: AC

## 2023-04-24 MED ORDER — ALENDRONATE SODIUM 70 MG PO TABS
ORAL_TABLET | ORAL | 3 refills | Status: AC
Start: 2023-04-24 — End: ?

## 2023-04-24 MED ORDER — AMLODIPINE BESYLATE 10 MG PO TABS: 10.0000 mg | ORAL_TABLET | Freq: Every day | ORAL | 3 refills | Status: AC

## 2023-04-24 NOTE — Addendum Note (Signed)
 Addended by: Abbe Amsterdam C on: 04/24/2023 12:36 PM   Modules accepted: Orders

## 2023-04-30 ENCOUNTER — Telehealth: Payer: Self-pay

## 2023-04-30 NOTE — Telephone Encounter (Signed)
 Dr Patsy Lager placed a referral to [facility] for a CT chest, however a fax was sent back stating it needs a PA.

## 2023-04-30 NOTE — Telephone Encounter (Signed)
 External orders have to be printed, signed, and faxed.   I have faxed the order.

## 2023-05-02 NOTE — Telephone Encounter (Signed)
 [facility] faxed [place] back asking to "please obtain Auth and fax to them."

## 2023-05-02 NOTE — Telephone Encounter (Signed)
 This was sent back stating that an auth was not needed.

## 2023-11-07 ENCOUNTER — Other Ambulatory Visit (HOSPITAL_BASED_OUTPATIENT_CLINIC_OR_DEPARTMENT_OTHER): Payer: Self-pay

## 2023-11-07 MED ORDER — COMIRNATY 30 MCG/0.3ML IM SUSY
0.3000 mL | PREFILLED_SYRINGE | Freq: Once | INTRAMUSCULAR | 0 refills | Status: AC
  Filled 2023-11-07: qty 0.3, 1d supply, fill #0

## 2023-11-11 ENCOUNTER — Telehealth: Payer: Self-pay | Admitting: Family Medicine

## 2023-11-11 DIAGNOSIS — E2839 Other primary ovarian failure: Secondary | ICD-10-CM

## 2023-11-11 DIAGNOSIS — Z1231 Encounter for screening mammogram for malignant neoplasm of breast: Secondary | ICD-10-CM

## 2023-11-11 NOTE — Telephone Encounter (Signed)
 Pt states needs Bone density lung test and mammogram to be sent to [facility] on [address] [place]. Pt also wants to know if pcp still wants labs done. Please call pt when orders sent in and with response about if labs needed.

## 2023-11-12 NOTE — Telephone Encounter (Signed)
 Called pt back- she says she was seen at [facility] regional ER yesterday but I cannot see these notes or results yet.  The note may not be up?  Advised they likely did not check her iron level so I would like her to come in and do labs- however we decided to just schedule an OV for next week and do labs then

## 2023-11-13 NOTE — Progress Notes (Addendum)
 [facility] [address] [place], [place] [address] [phone]  Date:  11/18/2023   Name:  Raja Caputi   DOB:  27-Apr-1950   MRN:  [id number]  PCP:  Watt Harlene BROCKS, MD    Chief Complaint: Follow-up (Discuss lab work )   History of Present Illness:  Kaleah Hagemeister is a 73 y.o. very pleasant female patient who presents with the following:  Patient seen today for follow-up-she was recently in the ER with concern of syncope.  Otherwise I saw her most recently in March - history of hypertension, GERD, prediabetes, hyperlipidemia, osteoporosis, vitamin D  deficiency.  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  She was found to have pneumonia. Treated with azithromycin 73 year old female significant past medical history of hypertension, tobacco use, recent viral upper respiratory infection, osteoporosis presenting with a chief complaint of syncope while at work. Patient states that she does not remember anything leading up to passing out but when she came around she was very hot. They had called for EMS. She denied any dizziness, chest pain or associated shortness of breath. She notes that she has had a cough for the last week or so. She denies any fevers, sore throat, nausea, vomiting or diarrhea. Patient denies any history of any significant heart problems. She notes that she has passed out once before and it was about 1 year ago when she was seen at another hospital. She states that they did not find anything in particular wrong with her at that time other than a low potassium. /// 74 year old female significant past medical history of hypertension, tobacco use, recent viral upper respiratory infection, osteoporosis presenting with a chief  complaint of syncope while at work. Differential diagnoses include but limited to cardiogenic syncope, vasovagal, dehydration, infection, cardiac arrhythmia. Blood work had already been ordered in the triage process. I did order an magnesium  level additionally given the low potassium. I did order chest x-ray.  EKG shows a sinus bradycardia at a rate of 54 bpm. PR, QRS and QTc are within normal limits. No ST segment elevation or depression. No T wave inversion. Normal axis.  Blood work returned showing overall reassuring CMP with the exception of the potassium at 3.0. This was replaced with oral potassium. Patient is already on oral potassium replacement at home and is advised to continue taking this. Patient's blood work was otherwise unremarkable with the exception of a minimally elevated neutrophil absolute count with a normal white blood cell count.  Chest x-ray returned showing patchy bibasilar opacities which may represent atelectasis or airspace disease. In the setting of the patient's really send upper respiratory infection and continued cough, Rales on auscultation I favor pneumonia. Will treat the patient as pneumonia and she is given a prescription for azithromycin. This was sent to her pharmacy. Discussed with the patient that given the pneumonia in conjunction with working and not having eaten anything all day may explain her episode of syncope. Discussed with her that if she has any further symptoms she should speak with her primary care physician about getting evaluated further. Discussed that she has not had a cardiac workup including an echo in at least 1 year if not ever as I do not see any on chart  review. Discussed that if she has any further syncopal episodes or if her symptoms do not improve after treating the pneumonia that she will need further evaluation by primary care for this. Her sister is at bedside. All questions were answered. They acknowledge agreement understanding with this  plan. Patient is discharged.   XR CHEST 2 VIEWS, 11/11/2023 10:56 PM INDICATION:syncope  COMPARISON: 06/16/2018 chest radiograph. FINDINGS:  . Support devices: None.  . Chest: Normal size and contours of the cardiomediastinal silhouette.  Patchy bibasilar opacities. No pleural effusion or pneumothorax.  SABRA Upper abdomen: Normal.  . Musculoskeletal structures: Degenerative changes. IMPRESSION:  Patchy bibasilar opacities which may represent atelectasis or airspace disease.   Discussed the use of AI scribe software for clinical note transcription with the patient, who gave verbal consent to proceed.  History of Present Illness Glenisha Gundry is a 73 year old female who presents with a recent episode of syncope and pneumonia.  Approximately one week ago, she experienced an episode of syncope at work, feeling overheated due to wearing a fleece sweatshirt and fluctuating air conditioning. No prodromal symptoms were noted, and she did not sustain any injuries from the fall, although she landed on her knee. She has a history of hypokalemia, previously requiring intravenous treatment, but this episode was managed with oral potassium supplementation.  Following the syncope, a chest x-ray revealed pneumonia, although she was asymptomatic with no cough, fever, or dyspnea. She has completed treatment for pneumonia and is currently working on regaining her strength and appetite.  She reports fatigue and is concerned about her hemoglobin levels, which she believes are low. She is considering iron supplementation as her current vitamins do not contain iron.  Her work schedule has changed to 3 PM to 7:30 PM, affecting her sleep pattern. She is adjusting to the new schedule, working six days a week due to overtime.  Her stepmother passed away in May 12, 2023 from metastatic cancer. 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  She received her COVID booster last week but typically avoids flu or pneumonia vaccines due to concerns about their contents.   Patient Active Problem List   Diagnosis Date Noted   Primary osteoarthritis of left knee 04/21/2021   Hyperlipidemia 06/24/2018   Pre-diabetes 03/15/2016   Vitamin D  deficiency 03/15/2016   Gastroesophageal reflux disease 03/15/2016   Osteoporosis 06/16/2015   Benign hypertension 04/30/2013   Current tobacco use 11/04/2012    Past Medical History:  Diagnosis Date   Arthritis    Gastroesophageal reflux disease 03/15/2016   Hypertension    Osteoporosis 06/16/2015    Past Surgical History:  Procedure Laterality Date   PERIPHERAL VASCULAR THROMBECTOMY Right    patient reports she had DVT in right leg ~2018/19   TONSILLECTOMY      Social History   Tobacco Use   Smoking status: Every Day    Current packs/day: 1.00    Average packs/day: 1 pack/day for 43.0 years (43.0 ttl pk-yrs)    Types: Cigarettes   Smokeless tobacco: Never  Vaping Use   Vaping status: Never Used  Substance Use Topics   Alcohol use: Not Currently   Drug use: Yes    Types: Marijuana    Comment: once a week    Family History  Problem Relation Age of Onset   Hypertension Mother    Hypertension Father     No Known Allergies  Medication list has been  reviewed and updated.  Current Outpatient Medications on File Prior to Visit  Medication Sig Dispense Refill   alendronate  (FOSAMAX ) 70 MG tablet TAKE 1 TABLET BY MOUTH ONCE A WEEK. TAKE WITH A FULL GLASS OF WATER ON AN EMPTY STOMACH. 12 tablet 3   amLODipine  (NORVASC ) 10 MG tablet Take 1 tablet (10 mg total) by mouth daily. 90 tablet 3   atorvastatin  (LIPITOR) 10 MG tablet Take 1 tablet (10 mg total) by mouth daily. 90 tablet 3   CALCIUM -MAGNESIUM -ZINC PO Take by mouth.     Cholecalciferol (VITAMIN D3) 1.25 MG (50000 UT) capsule Take 1 weekly for 12 weeks 12 capsule 0   Multiple Vitamin (MULTIVITAMIN WITH MINERALS)  TABS tablet Take 1 tablet by mouth daily.     POTASSIUM GLUCONATE PO Take by mouth.     triamcinolone  cream (KENALOG ) 0.1 % APPLY TO AFFECTED AREA TWICE A DAY 30 g 1   No current facility-administered medications on file prior to visit.    Review of Systems:  As per HPI- otherwise negative.   Physical Examination: Vitals:   11/18/23 0959  BP: 118/78  Pulse: 71  SpO2: 96%   Vitals:   11/18/23 0959  Weight: 152 lb 6.4 oz (69.1 kg)  Height: 5' 2.5 (1.588 m)   Body mass index is 27.43 kg/m. Ideal Body Weight: Weight in (lb) to have BMI = 25: 138.6  GEN: no acute distress.  Minimal overweight, looks well  HEENT: Atraumatic, Normocephalic.  Ears and Nose: No external deformity. CV: RRR, No M/G/R. No JVD. No thrill. No extra heart sounds. PULM: CTA B, no wheezes, crackles, rhonchi. No retractions. No resp. distress. No accessory muscle use. ABD: S, NT, ND, +BS. No rebound. No HSM. EXTR: No c/c/e PSYCH: Normally interactive. Conversant.    Assessment and Plan: Hypokalemia - Plan: Basic metabolic panel with GFR  Mild anemia - Plan: Ferritin  Encounter for screening mammogram for malignant neoplasm of breast - Plan: MM Digital Screening  Screening for lung cancer - Plan: CT CHEST LUNG CA SCREEN LOW DOSE W/O CM  Estrogen deficiency - Plan: DG Bone Density  Immunization due  Assessment & Plan Pneumonia Recent diagnosis confirmed by chest x-ray. Asymptomatic and completed treatment. - Ordered routine lung cancer screening CT in one month to coincide with follow-up imaging.  Syncope Recent episode likely due to overheating and hypokalemia. No further episodes or injuries reported. - Check potassium level. Let me know if any further symptoms  Mild anemia Fatigue reported with hemoglobin level at 12, consistent with baseline. No iron supplementation taken. - Check iron level to assess for deficiency.  General Health Maintenance Routine health maintenance discussed.  COVID-19 booster received, flu vaccine pending. Mammogram and bone density screenings due. Preference for screenings at [facility]. - Administer flu vaccine today. - Order mammogram. - Order bone density scan.  Signed Harlene Schroeder, MD  Received labs as below, message to patient Results for orders placed or performed in visit on 11/18/23  Basic metabolic panel with GFR   Collection Time: 11/18/23 10:37 AM  Result Value Ref Range   Sodium 144 135 - 145 mEq/L   Potassium 3.8 3.5 - 5.1 mEq/L   Chloride 105 96 - 112 mEq/L   CO2 28 19 - 32 mEq/L   Glucose, Bld 105 (H) 70 - 99 mg/dL   BUN 9 6 - 23 mg/dL   Creatinine, Ser 9.21 0.40 - 1.20 mg/dL   GFR 23.28 >39.99 mL/min   Calcium  9.3 8.4 -  10.5 mg/dL  Ferritin   Collection Time: 11/18/23 10:37 AM  Result Value Ref Range   Ferritin 30.2 10.0 - 291.0 ng/mL

## 2023-11-18 ENCOUNTER — Encounter: Payer: Self-pay | Admitting: Family Medicine

## 2023-11-18 ENCOUNTER — Ambulatory Visit: Admitting: Family Medicine

## 2023-11-18 VITALS — BP 118/78 | HR 71 | Ht 62.5 in | Wt 152.4 lb

## 2023-11-18 DIAGNOSIS — Z1231 Encounter for screening mammogram for malignant neoplasm of breast: Secondary | ICD-10-CM

## 2023-11-18 DIAGNOSIS — D649 Anemia, unspecified: Secondary | ICD-10-CM

## 2023-11-18 DIAGNOSIS — E876 Hypokalemia: Secondary | ICD-10-CM | POA: Diagnosis not present

## 2023-11-18 DIAGNOSIS — Z23 Encounter for immunization: Secondary | ICD-10-CM

## 2023-11-18 DIAGNOSIS — Z122 Encounter for screening for malignant neoplasm of respiratory organs: Secondary | ICD-10-CM

## 2023-11-18 DIAGNOSIS — E2839 Other primary ovarian failure: Secondary | ICD-10-CM

## 2023-11-18 LAB — BASIC METABOLIC PANEL WITH GFR
BUN: 9 mg/dL (ref 6–23)
CO2: 28 meq/L (ref 19–32)
Calcium: 9.3 mg/dL (ref 8.4–10.5)
Chloride: 105 meq/L (ref 96–112)
Creatinine, Ser: 0.78 mg/dL (ref 0.40–1.20)
GFR: 76.71 mL/min (ref >60.00)
Glucose, Bld: 105 mg/dL — ABNORMAL HIGH (ref 70–99)
Potassium: 3.8 meq/L (ref 3.5–5.1)
Sodium: 144 meq/L (ref 135–145)

## 2023-11-18 LAB — FERRITIN: Ferritin: 30.2 ng/mL (ref 10.0–291.0)

## 2023-11-18 NOTE — Addendum Note (Signed)
 Addended by: ORVIN HARLENE HERO on: 11/18/2023 04:37 PM   Modules accepted: Orders

## 2023-11-18 NOTE — Patient Instructions (Addendum)
 Good to see you today You got a flu shot today Recommend RSV vaccine at your convenience  You are up to date on pneumonia vaccine I will be in touch with your labs Mammogram, bone density and lung cancer screening all ordered today- to be done at [facility]  Address: [address], [place], [place] [address] Phone: [phone]  Allow at least 4-6 weeks to do the CT scan to make sure the pneumonia is all cleared up

## 2023-12-20 ENCOUNTER — Other Ambulatory Visit (HOSPITAL_BASED_OUTPATIENT_CLINIC_OR_DEPARTMENT_OTHER): Payer: Self-pay

## 2023-12-20 LAB — HM MAMMOGRAPHY

## 2023-12-20 LAB — HM DEXA SCAN

## 2023-12-20 MED ORDER — AREXVY 120 MCG/0.5ML IM SUSR
0.5000 mL | Freq: Once | INTRAMUSCULAR | 0 refills | Status: AC
  Filled 2023-12-20: qty 0.5, 1d supply, fill #0

## 2023-12-25 ENCOUNTER — Encounter: Payer: Self-pay | Admitting: Family Medicine

## 2024-01-21 ENCOUNTER — Telehealth: Payer: Self-pay | Admitting: *Deleted

## 2024-01-21 NOTE — Telephone Encounter (Signed)
 Copied from CRM #[id number]. Topic: Referral - Question >> Jan 21, 2024 11:50 AM Revonda D wrote: Reason for CRM: Pt stated that [facility] is wanting to do a follow up a follow up due to her lung scan and needs another referral submitted to the office. Pt would like a callback with an update.

## 2024-01-21 NOTE — Telephone Encounter (Signed)
 CT scan completed 12/20/23- they recommended a one year recheck I called pt back- she got a letter indicating there is something else that needs follow-up but I am not sure what this is.  She will bring in a copy of the letter for me to see

## 2024-01-28 ENCOUNTER — Encounter: Payer: Self-pay | Admitting: Family Medicine

## 2024-04-27 ENCOUNTER — Encounter: Admitting: Family Medicine
